# Patient Record
Sex: Male | Born: 1965 | Race: White | Hispanic: No | Marital: Married | Smoking: Former smoker
Health system: Southern US, Community
[De-identification: ages and names within clinical notes are randomized; demographics above are authoritative.]

## PROBLEM LIST (undated history)

## (undated) DIAGNOSIS — N200 Calculus of kidney: Secondary | ICD-10-CM

## (undated) HISTORY — PX: TONSILLECTOMY: SUR1361

---

## 2014-01-17 ENCOUNTER — Emergency Department (HOSPITAL_BASED_OUTPATIENT_CLINIC_OR_DEPARTMENT_OTHER): Payer: Commercial Managed Care - PPO

## 2014-01-17 ENCOUNTER — Encounter (HOSPITAL_BASED_OUTPATIENT_CLINIC_OR_DEPARTMENT_OTHER): Payer: Self-pay | Admitting: Emergency Medicine

## 2014-01-17 ENCOUNTER — Emergency Department (HOSPITAL_BASED_OUTPATIENT_CLINIC_OR_DEPARTMENT_OTHER)
Admission: EM | Admit: 2014-01-17 | Discharge: 2014-01-17 | Disposition: A | Discharge status: Home / Self Care | Payer: Commercial Managed Care - PPO | Attending: Emergency Medicine | Admitting: Emergency Medicine

## 2014-01-17 DIAGNOSIS — R079 Chest pain, unspecified: Secondary | ICD-10-CM

## 2014-01-17 DIAGNOSIS — E669 Obesity, unspecified: Secondary | ICD-10-CM | POA: Insufficient documentation

## 2014-01-17 DIAGNOSIS — R072 Precordial pain: Secondary | ICD-10-CM | POA: Insufficient documentation

## 2014-01-17 HISTORY — DX: Calculus of kidney: N20.0

## 2014-01-17 LAB — BASIC METABOLIC PANEL
BUN: 13 mg/dL (ref 6–23)
CALCIUM: 9.2 mg/dL (ref 8.4–10.5)
CHLORIDE: 105 meq/L (ref 96–112)
CO2: 26 meq/L (ref 19–32)
Creatinine, Ser: 1.1 mg/dL (ref 0.50–1.35)
GFR calc Af Amer: >90 mL/min (ref >90)
GFR calc non Af Amer: 79 mL/min — ABNORMAL LOW (ref >90)
GLUCOSE: 93 mg/dL (ref 70–99)
Potassium: 4.1 mEq/L (ref 3.7–5.3)
SODIUM: 141 meq/L (ref 137–147)

## 2014-01-17 LAB — CBC WITH DIFFERENTIAL/PLATELET
BASOS ABS: 0 10*3/uL (ref 0.0–0.1)
Basophils Relative: 1 % (ref 0–1)
EOS PCT: 3 % (ref 0–5)
Eosinophils Absolute: 0.2 10*3/uL (ref 0.0–0.7)
HEMATOCRIT: 41.4 % (ref 39.0–52.0)
Hemoglobin: 14.1 g/dL (ref 13.0–17.0)
LYMPHS ABS: 1.9 10*3/uL (ref 0.7–4.0)
LYMPHS PCT: 26 % (ref 12–46)
MCH: 29.8 pg (ref 26.0–34.0)
MCHC: 34.1 g/dL (ref 30.0–36.0)
MCV: 87.5 fL (ref 78.0–100.0)
Monocytes Absolute: 0.7 10*3/uL (ref 0.1–1.0)
Monocytes Relative: 9 % (ref 3–12)
NEUTROS ABS: 4.8 10*3/uL (ref 1.7–7.7)
Neutrophils Relative %: 63 % (ref 43–77)
Platelets: 193 10*3/uL (ref 150–400)
RBC: 4.73 MIL/uL (ref 4.22–5.81)
RDW: 11.7 % (ref 11.5–15.5)
WBC: 7.6 10*3/uL (ref 4.0–10.5)

## 2014-01-17 LAB — TROPONIN I: Troponin I: <0.3 ng/mL (ref <0.30)

## 2014-01-17 MED ORDER — NITROGLYCERIN 0.4 MG SL SUBL
0.4000 mg | SUBLINGUAL_TABLET | SUBLINGUAL | Status: AC | PRN
  Administered 2014-01-17 (×3): 0.4 mg via SUBLINGUAL
  Filled 2014-01-17 (×2): qty 25

## 2014-01-17 MED ORDER — ASPIRIN 81 MG PO CHEW
324.0000 mg | CHEWABLE_TABLET | Freq: Once | ORAL | Status: AC
  Administered 2014-01-17: 162 mg via ORAL
  Filled 2014-01-17: qty 4

## 2014-01-17 NOTE — ED Notes (Signed)
Substernal CP 4/10 that woke him up during the night.  Sts he had this pain last week for a couple days and he "figured it was a pulled muscle."  No prior hx of cp.

## 2014-01-17 NOTE — ED Notes (Signed)
Patient transported to X-ray 

## 2014-01-17 NOTE — ED Provider Notes (Signed)
CSN: [id number]     Arrival date & time 01/17/14  1120 History   First MD Initiated Contact with Patient 01/17/14 1127     Chief Complaint  Patient presents with  . Chest Pain   (Consider location/radiation/quality/duration/timing/severity/associated sxs/prior Treatment) HPI 48 year old male presents today complaining of chest pain that has been present since 2 AM. He states he had some chest pain earlier in the week thought that it was musculoskeletal in nature. At that time it was present for several days without resolution. It then went away without intervention. It was worse with movement. He had pain that was present in the substernal region on awakening at about 2 AM. This pain has been present constantly at a level of 4/10 since that time. He has had no associated symptoms. He has no prior history of cardiac problems and has no family history of cardiac problems. He is not a smoker History reviewed. No pertinent past medical history. History reviewed. No pertinent past surgical history. No family history on file. History  Substance Use Topics  . Smoking status: Not on file  . Smokeless tobacco: Not on file  . Alcohol Use: Not on file    Review of Systems  All other systems reviewed and are negative.    Allergies  Review of patient's allergies indicates not on file.  Home Medications  No current outpatient prescriptions on file. BP 142/90  Pulse 68  Temp(Src) 98.8 F (37.1 C) (Oral)  Resp 16  Ht 5\' 9"  (1.753 m)  Wt 204 lb (92.534 kg)  BMI 30.11 kg/m2  SpO2 99% Physical Exam  Nursing note and vitals reviewed. Constitutional: He is oriented to person, place, and time. He appears well-developed and well-nourished.  Obese  HENT:  Head: Normocephalic and atraumatic.  Right Ear: External ear normal.  Left Ear: External ear normal.  Eyes: Conjunctivae and EOM are normal. Pupils are equal, round, and reactive to light.  Neck: Normal range of motion. Neck supple.   Cardiovascular: Normal rate, regular rhythm, normal heart sounds and intact distal pulses.   Pulmonary/Chest: Effort normal and breath sounds normal.  Abdominal: Soft. Bowel sounds are normal.  Musculoskeletal: Normal range of motion. He exhibits no edema and no tenderness.  Neurological: He is alert and oriented to person, place, and time. He has normal reflexes.  Skin: Skin is warm and dry.  Psychiatric: He has a normal mood and affect. His behavior is normal. Judgment and thought content normal.    ED Course  Procedures (including critical care time) Labs Review Labs Reviewed - No data to display Imaging Review No results found.  EKG Interpretation    Date/Time:  Sunday January 17 2014 11:25:56 EST Ventricular Rate:  68 PR Interval:  144 QRS Duration: 90 QT Interval:  370 QTC Calculation: 393 R Axis:   -3 Text Interpretation:  Normal sinus rhythm Cannot rule out Anterior infarct , age undetermined Abnormal ECG Confirmed by Jireh Elmore MD, Kazue Cerro ([id number]) on 01/17/2014 11:28:37 AM            MDM  The patient pain free here after 3 sublingual that glycerin. He received the aspirin here in the emergency department. EKG is normal and delta troponin is normal with total from beginning of pain time  To 12.5 hours. Discussed patient's care with Dr. Diona Browner. He will have the office call the patient tomorrow for an appointment in the next 48-72 hours. I have confirmed a phone number with Dr. Diona Browner and with the patient's wife. He  is given return precautions if he has chest pain or any other worsening symptoms such as shortness of breath or diaphoresis he should return to the hospital. He is instructed to rest in the interim until he is followed up.  The patient voices understanding of plan and is discharged home in improved condition.  Hilario Quarryanielle S Mansi Tokar, MD 01/17/14 [phone]

## 2014-01-17 NOTE — ED Notes (Signed)
MD at bedside. 

## 2014-01-17 NOTE — ED Notes (Signed)
MD at bedside discussing test results and dispo plan of care. 

## 2014-01-17 NOTE — Discharge Instructions (Signed)
You should receive a call from the cardiologist's office tomorrow for followup in one to 3 days. Please call them if you do not receive a call. Return if you are worse at any time. Please do not do anything exertional and followup if you are worse especially with chest pain, shortness of breath, or sweating.  Chest Pain (Nonspecific) It is often hard to give a specific diagnosis for the cause of chest pain. There is always a chance that your pain could be related to something serious, such as a heart attack or a blood clot in the lungs. You need to follow up with your caregiver for further evaluation. CAUSES   Heartburn.  Pneumonia or bronchitis.  Anxiety or stress.  Inflammation around your heart (pericarditis) or lung (pleuritis or pleurisy).  A blood clot in the lung.  A collapsed lung (pneumothorax). It can develop suddenly on its own (spontaneous pneumothorax) or from injury (trauma) to the chest.  Shingles infection (herpes zoster virus). The chest wall is composed of bones, muscles, and cartilage. Any of these can be the source of the pain.  The bones can be bruised by injury.  The muscles or cartilage can be strained by coughing or overwork.  The cartilage can be affected by inflammation and become sore (costochondritis). DIAGNOSIS  Lab tests or other studies, such as X-rays, electrocardiography, stress testing, or cardiac imaging, may be needed to find the cause of your pain.  TREATMENT   Treatment depends on what may be causing your chest pain. Treatment may include:  Acid blockers for heartburn.  Anti-inflammatory medicine.  Pain medicine for inflammatory conditions.  Antibiotics if an infection is present.  You may be advised to change lifestyle habits. This includes stopping smoking and avoiding alcohol, caffeine, and chocolate.  You may be advised to keep your head raised (elevated) when sleeping. This reduces the chance of acid going backward from your stomach  into your esophagus.  Most of the time, nonspecific chest pain will improve within 2 to 3 days with rest and mild pain medicine. HOME CARE INSTRUCTIONS   If antibiotics were prescribed, take your antibiotics as directed. Finish them even if you start to feel better.  For the next few days, avoid physical activities that bring on chest pain. Continue physical activities as directed.  Do not smoke.  Avoid drinking alcohol.  Only take over-the-counter or prescription medicine for pain, discomfort, or fever as directed by your caregiver.  Follow your caregiver's suggestions for further testing if your chest pain does not go away.  Keep any follow-up appointments you made. If you do not go to an appointment, you could develop lasting (chronic) problems with pain. If there is any problem keeping an appointment, you must call to reschedule. SEEK MEDICAL CARE IF:   You think you are having problems from the medicine you are taking. Read your medicine instructions carefully.  Your chest pain does not go away, even after treatment.  You develop a rash with blisters on your chest. SEEK IMMEDIATE MEDICAL CARE IF:   You have increased chest pain or pain that spreads to your arm, neck, jaw, back, or abdomen.  You develop shortness of breath, an increasing cough, or you are coughing up blood.  You have severe back or abdominal pain, feel nauseous, or vomit.  You develop severe weakness, fainting, or chills.  You have a fever. THIS IS AN EMERGENCY. Do not wait to see if the pain will go away. Get medical help at once.  Call your local emergency services (911 in [place] Do not drive yourself to the hospital. MAKE SURE YOU:   Understand these instructions.  Will watch your condition.  Will get help right away if you are not doing well or get worse. Document Released: 09/12/2005 Document Revised: 02/25/2012 Document Reviewed: 07/08/2008 [facility] Patient Information 2014 [place], [place].

## 2014-02-03 DIAGNOSIS — N200 Calculus of kidney: Secondary | ICD-10-CM | POA: Insufficient documentation

## 2014-02-04 ENCOUNTER — Encounter: Payer: Self-pay | Admitting: Cardiology

## 2014-02-04 ENCOUNTER — Ambulatory Visit (INDEPENDENT_AMBULATORY_CARE_PROVIDER_SITE_OTHER): Payer: Commercial Managed Care - PPO | Admitting: Cardiology

## 2014-02-04 VITALS — BP 108/78 | HR 80 | Ht 69.0 in | Wt 214.0 lb

## 2014-02-04 DIAGNOSIS — R079 Chest pain, unspecified: Secondary | ICD-10-CM | POA: Insufficient documentation

## 2014-02-04 NOTE — Patient Instructions (Signed)
Your physician has requested that you have an exercise tolerance test. For further information please visit [email or web address]. Please also follow instruction sheet, as given.    Exercise Stress Electrocardiogram An exercise stress electrocardiogram is a test to check how blood flows to your heart. It is done to find areas of poor blood flow. You will need to walk on a treadmill for this test. The electrocardiogram will record your heartbeat when you are at rest and when you are exercising. BEFORE THE PROCEDURE  Do not have drinks with caffeine or foods with caffeine for 24 hours before the test, or as told by your doctor.  Follow your doctor's instructions about eating and drinking before the test.  Ask your doctor what medicines you should or should not take before the test. Take your medicines with water unless told by your doctor not to.  If you use an inhaler, bring it with you to the test.  Bring a snack to eat after the test.  Do not  smoke for 4 hours before the test.  Wear comfortable shoes and clothing. PROCEDURE  You will have patches put on your chest. Small areas of your chest may need to be shaved. Wires will be connected to the patches.  Your heart rate will be watched while you are resting and while you are exercising.  You will walk on the treadmill. The treadmill will slowly get faster to raise your heart rate.  The test will take about 1 2 hours. AFTER THE PROCEDURE  Your heart rate and blood pressure will be watched after the test.  You may return to your normal diet, activities, and medicines or as told by your doctor. Document Released: 05/21/2008 Document Revised: 09/23/2013 Document Reviewed: 08/10/2013 [employer] Patient Information 2014 [employer].  

## 2014-02-04 NOTE — Progress Notes (Signed)
     HPI: 48 year old male for evaluation of chest pain. Seen in the emergency room in February of 2015 with chest pain. Chest x-ray showed no acute cardiopulmonary disease. Electrocardiogram showed no ST changes. Hemoglobin normal. Troponin negative x2. BUN and creatinine normal. The patient typically does not have dyspnea on exertion, orthopnea, PND, pedal edema, exertional chest pain, palpitations or syncope. Approximately one month ago the patient developed chest pain in the substernal area radiating to the right. No associated symptoms. Not pleuritic, positional or related to food. It lasted 2 days and resolved. He felt it may be a pulled muscle. Approximately one week later he awoke with substernal chest pain without radiation. No associated symptoms. Again not pleuritic or positional and no water brash. Patient was taken to the emergency room and symptoms relieved with nitroglycerin after 9 hours. No pain since. Workup as described above.  No current outpatient prescriptions on file.   No current facility-administered medications for this visit.    Allergies  Allergen Reactions  . Amoxicillin Nausea Only    Past Medical History  Diagnosis Date  . Kidney stones     Past Surgical History  Procedure Laterality Date  . Tonsillectomy      History   Social History  . Marital Status: Married    Spouse Name: N/A    Number of Children: 3  . Years of Education: N/A   Occupational History  .     Social History Main Topics  . Smoking status: Former Games developermoker  . Smokeless tobacco: Never Used  . Alcohol Use: Yes     Comment: rarely  . Drug Use: No  . Sexual Activity: Not on file   Other Topics Concern  . Not on file   Social History Narrative  . No narrative on file    Family History  Problem Relation Age of Onset  . Heart disease      No family history    ROS: no fevers or chills, productive cough, hemoptysis, dysphasia, odynophagia, melena, hematochezia, dysuria,  hematuria, rash, seizure activity, orthopnea, PND, pedal edema, claudication. Remaining systems are negative.  Physical Exam:   Blood pressure 108/78, pulse 80, height 5\' 9"  (1.753 m), weight 214 lb (97.07 kg).  General:  Well developed/well nourished in NAD Skin warm/dry Patient not depressed No peripheral clubbing Back-normal HEENT-normal/normal eyelids Neck supple/normal carotid upstroke bilaterally; no bruits; no JVD; no thyromegaly chest - CTA/ normal expansion CV - RRR/normal S1 and S2; no murmurs, rubs or gallops;  PMI nondisplaced Abdomen -NT/ND, no HSM, no mass, + bowel sounds, no bruit 2+ femoral pulses, no bruits Ext-no edema, chords, 2+ DP Neuro-grossly nonfocal  ECG 01/18/2012-sinus rhythm with no ST changes.

## 2014-02-04 NOTE — Assessment & Plan Note (Signed)
Symptoms atypical. Previous electrocardiogram and troponins normal. Symptoms were relieved with nitroglycerin. Plan exercise treadmill for risk stratification. If symptoms persist and exercise treadmill negative may need GI evaluation.

## 2014-02-16 ENCOUNTER — Ambulatory Visit (HOSPITAL_COMMUNITY)
Admission: RE | Admit: 2014-02-16 | Discharge: 2014-02-16 | Disposition: A | Discharge status: Home / Self Care | Payer: Commercial Managed Care - PPO | Source: Ambulatory Visit | Attending: Cardiology | Admitting: Cardiology

## 2014-02-16 DIAGNOSIS — R079 Chest pain, unspecified: Secondary | ICD-10-CM

## 2014-02-22 ENCOUNTER — Telehealth (HOSPITAL_COMMUNITY): Payer: Self-pay | Admitting: *Deleted

## 2015-03-08 ENCOUNTER — Encounter (HOSPITAL_COMMUNITY): Payer: Self-pay | Admitting: *Deleted

## 2015-03-08 ENCOUNTER — Emergency Department (HOSPITAL_COMMUNITY)
Admission: EM | Admit: 2015-03-08 | Discharge: 2015-03-08 | Disposition: A | Discharge status: Home / Self Care | Payer: Commercial Managed Care - PPO | Attending: Emergency Medicine | Admitting: Emergency Medicine

## 2015-03-08 ENCOUNTER — Emergency Department (HOSPITAL_COMMUNITY): Payer: Commercial Managed Care - PPO

## 2015-03-08 DIAGNOSIS — Z87442 Personal history of urinary calculi: Secondary | ICD-10-CM | POA: Diagnosis not present

## 2015-03-08 DIAGNOSIS — R109 Unspecified abdominal pain: Secondary | ICD-10-CM | POA: Diagnosis present

## 2015-03-08 DIAGNOSIS — N23 Unspecified renal colic: Secondary | ICD-10-CM | POA: Diagnosis not present

## 2015-03-08 DIAGNOSIS — Z87891 Personal history of nicotine dependence: Secondary | ICD-10-CM | POA: Diagnosis not present

## 2015-03-08 LAB — COMPREHENSIVE METABOLIC PANEL
ALBUMIN: 4.4 g/dL (ref 3.5–5.2)
ALT: 21 U/L (ref 0–53)
AST: 19 U/L (ref 0–37)
Alkaline Phosphatase: 63 U/L (ref 39–117)
Anion gap: 12 (ref 5–15)
BILIRUBIN TOTAL: 0.6 mg/dL (ref 0.3–1.2)
BUN: 16 mg/dL (ref 6–23)
CALCIUM: 9 mg/dL (ref 8.4–10.5)
CHLORIDE: 102 mmol/L (ref 96–112)
CO2: 23 mmol/L (ref 19–32)
CREATININE: 1.26 mg/dL (ref 0.50–1.35)
GFR calc Af Amer: 77 mL/min — ABNORMAL LOW (ref >90)
GFR calc non Af Amer: 66 mL/min — ABNORMAL LOW (ref >90)
Glucose, Bld: 163 mg/dL — ABNORMAL HIGH (ref 70–99)
POTASSIUM: 3.5 mmol/L (ref 3.5–5.1)
Sodium: 137 mmol/L (ref 135–145)
Total Protein: 6.9 g/dL (ref 6.0–8.3)

## 2015-03-08 LAB — URINALYSIS, ROUTINE W REFLEX MICROSCOPIC
BILIRUBIN URINE: NEGATIVE
Glucose, UA: NEGATIVE mg/dL
Ketones, ur: 40 mg/dL — AB
Leukocytes, UA: NEGATIVE
NITRITE: NEGATIVE
Protein, ur: NEGATIVE mg/dL
Specific Gravity, Urine: 1.021 (ref 1.005–1.030)
UROBILINOGEN UA: 1 mg/dL (ref 0.0–1.0)
pH: 5.5 (ref 5.0–8.0)

## 2015-03-08 LAB — URINE MICROSCOPIC-ADD ON

## 2015-03-08 LAB — CBC WITH DIFFERENTIAL/PLATELET
BASOS ABS: 0 10*3/uL (ref 0.0–0.1)
BASOS PCT: 0 % (ref 0–1)
Eosinophils Absolute: 0.1 10*3/uL (ref 0.0–0.7)
Eosinophils Relative: 1 % (ref 0–5)
HEMATOCRIT: 40 % (ref 39.0–52.0)
Hemoglobin: 13.5 g/dL (ref 13.0–17.0)
LYMPHS PCT: 21 % (ref 12–46)
Lymphs Abs: 1.9 10*3/uL (ref 0.7–4.0)
MCH: 28.6 pg (ref 26.0–34.0)
MCHC: 33.8 g/dL (ref 30.0–36.0)
MCV: 84.7 fL (ref 78.0–100.0)
MONO ABS: 0.7 10*3/uL (ref 0.1–1.0)
Monocytes Relative: 8 % (ref 3–12)
Neutro Abs: 6 10*3/uL (ref 1.7–7.7)
Neutrophils Relative %: 70 % (ref 43–77)
PLATELETS: 198 10*3/uL (ref 150–400)
RBC: 4.72 MIL/uL (ref 4.22–5.81)
RDW: 13 % (ref 11.5–15.5)
WBC: 8.7 10*3/uL (ref 4.0–10.5)

## 2015-03-08 LAB — LIPASE, BLOOD: Lipase: 46 U/L (ref 11–59)

## 2015-03-08 MED ORDER — FENTANYL CITRATE 0.05 MG/ML IJ SOLN: 50.0000 ug | Freq: Once | INTRAMUSCULAR | Status: DC

## 2015-03-08 MED ORDER — OXYCODONE-ACETAMINOPHEN 5-325 MG PO TABS: 1.0000 | ORAL_TABLET | ORAL | Status: DC | PRN

## 2015-03-08 MED ORDER — KETOROLAC TROMETHAMINE 10 MG PO TABS: 10.0000 mg | ORAL_TABLET | Freq: Four times a day (QID) | ORAL | Status: DC | PRN

## 2015-03-08 MED ORDER — ONDANSETRON 4 MG PO TBDP
ORAL_TABLET | ORAL | Status: DC
Start: 2015-03-08 — End: 2024-08-07

## 2015-03-08 MED ORDER — TAMSULOSIN HCL 0.4 MG PO CAPS: 0.4000 mg | ORAL_CAPSULE | Freq: Every day | ORAL | Status: DC

## 2015-03-08 NOTE — ED Notes (Signed)
Pt a/o x 4 on d/c with family. 

## 2015-03-08 NOTE — Discharge Instructions (Signed)

## 2015-03-08 NOTE — ED Provider Notes (Signed)
CSN: [id number]     Arrival date & time 03/08/15  0110 History  This chart was scribed for Loren Raceravid Tayvin Preslar, MD by Richarda Overlieichard Holland, ED Scribe. This patient was seen in room D32C/D32C and the patient's care was started 1:57 AM.   Chief Complaint  Patient presents with  . Flank Pain   The history is provided by the patient. No language interpreter was used.   HPI Comments: Glo HerringJerry Reeves is a 49 y.o. male with a history of kidney stones who presents to the Emergency Department complaining of sudden right flank pain that started at 9:45PM last night. Pt states that his pain radiates towards his abdomen and is waxing and waning. Pt states that he is in minimal pain at this time compared to severe pain when he presented to the ED. He reports associated nausea but says it has improved at this time. He states that he tried to take a hot shower to relieve his pain which failed to provide him pain relief. Pt reports his last kidney stones was 8 years ago and states that he does not think his current episode is a kidney stone because his pain is in a different location. He reports he is allergic to amoxicillin. Pt denies any urinary symptoms.    Past Medical History  Diagnosis Date  . Kidney stones    Past Surgical History  Procedure Laterality Date  . Tonsillectomy     Family History  Problem Relation Age of Onset  . Heart disease      No family history   History  Substance Use Topics  . Smoking status: Former Games developermoker  . Smokeless tobacco: Never Used  . Alcohol Use: Yes     Comment: rarely    Review of Systems  Constitutional: Negative for fever and chills.  Respiratory: Negative for cough and shortness of breath.   Cardiovascular: Negative for chest pain.  Gastrointestinal: Positive for nausea and abdominal pain. Negative for vomiting, diarrhea and constipation.  Genitourinary: Positive for flank pain. Negative for dysuria, hematuria and difficulty urinating.  Musculoskeletal: Positive for  back pain. Negative for myalgias, neck pain and neck stiffness.  Skin: Negative for rash and wound.  Neurological: Negative for dizziness, weakness, light-headedness, numbness and headaches.  All other systems reviewed and are negative.     Allergies  Amoxicillin  Home Medications   Prior to Admission medications   Medication Sig Start Date End Date Taking? Authorizing Provider  ibuprofen (ADVIL,MOTRIN) 200 MG tablet Take 400 mg by mouth every 6 (six) hours as needed for mild pain.   Yes Historical Provider, MD  ketorolac (TORADOL) 10 MG tablet Take 1 tablet (10 mg total) by mouth every 6 (six) hours as needed. 03/08/15   Loren Raceravid Resean Brander, MD  ondansetron (ZOFRAN ODT) 4 MG disintegrating tablet 4mg  ODT q4 hours prn nausea/vomit 03/08/15   Loren Raceravid Jeraldine Primeau, MD  oxyCODONE-acetaminophen (PERCOCET) 5-325 MG per tablet Take 1-2 tablets by mouth every 4 (four) hours as needed for moderate pain or severe pain. 03/08/15   Loren Raceravid Shirla Hodgkiss, MD  tamsulosin (FLOMAX) 0.4 MG CAPS capsule Take 1 capsule (0.4 mg total) by mouth daily. 03/08/15   Loren Raceravid Field Staniszewski, MD   BP 118/76 mmHg  Pulse 77  Temp(Src) 98 F (36.7 C) (Oral)  Resp 17  SpO2 98% Physical Exam  Constitutional: He is oriented to person, place, and time. He appears well-developed and well-nourished. No distress.  HENT:  Head: Normocephalic and atraumatic.  Mouth/Throat: Oropharynx is clear and moist.  Eyes: EOM  are normal. Pupils are equal, round, and reactive to light. Right eye exhibits no discharge. Left eye exhibits no discharge.  Neck: Normal range of motion. Neck supple. No tracheal deviation present.  Cardiovascular: Normal rate and regular rhythm.   Pulmonary/Chest: Effort normal and breath sounds normal. No respiratory distress. He has no wheezes. He has no rales.  Abdominal: Soft. Bowel sounds are normal. He exhibits no distension and no mass. There is no tenderness. There is no rebound and no guarding.  Musculoskeletal: Normal  range of motion. He exhibits no edema or tenderness.  No CVA tenderness bilaterally.  Neurological: He is alert and oriented to person, place, and time.  Skin: Skin is warm and dry. No rash noted. No erythema.  Psychiatric: He has a normal mood and affect. His behavior is normal.  Nursing note and vitals reviewed.   ED Course  Procedures   DIAGNOSTIC STUDIES: Oxygen Saturation is 100% on RA, normal by my interpretation.    COORDINATION OF CARE: 2:03 AM Discussed treatment plan with pt at bedside and pt agreed to plan.   Labs Review Labs Reviewed  COMPREHENSIVE METABOLIC PANEL - Abnormal; Notable for the following:    Glucose, Bld 163 (*)    GFR calc non Af Amer 66 (*)    GFR calc Af Amer 77 (*)    All other components within normal limits  URINALYSIS, ROUTINE W REFLEX MICROSCOPIC - Abnormal; Notable for the following:    APPearance CLOUDY (*)    Hgb urine dipstick MODERATE (*)    Ketones, ur 40 (*)    All other components within normal limits  CBC WITH DIFFERENTIAL/PLATELET  LIPASE, BLOOD  URINE MICROSCOPIC-ADD ON    Imaging Review Ct Abdomen Pelvis Wo Contrast  03/08/2015   CLINICAL DATA:  Right flank pain, onset last night.  EXAM: CT ABDOMEN AND PELVIS WITHOUT CONTRAST  TECHNIQUE: Multidetector CT imaging of the abdomen and pelvis was performed following the standard protocol without IV contrast.  COMPARISON:  None.  FINDINGS: There is a 3 mm calculus at the right ureterovesical junction with hydroureter and moderate hydronephrosis. No other urinary calculi are evident. No other acute findings are evident in the abdomen or pelvis.  There are unremarkable appearances of the liver, spleen, pancreas and adrenals. Bowel is unremarkable. No significant abnormality is evident in the lower chest  IMPRESSION: Obstructing 3 mm right UVJ calculus with hydroureter and moderate hydronephrosis.   Electronically Signed   By: Ellery Plunk M.D.   On: 03/08/2015 03:01     EKG  Interpretation None      MDM   Final diagnoses:  Renal colic on right side    I personally performed the services described in this documentation, which was scribed in my presence. The recorded information has been reviewed and is accurate.  Patient remains asymptomatic. Discharge home with follow-up with urology. Return precautions given.     Loren Racer, MD 03/08/15 [phone]

## 2015-03-08 NOTE — ED Notes (Signed)
Pt in c/o right flank pain that started about an hour ago, states he has a history of kidney stones and this doesn't feel the same, c/o nausea but denies vomiting, pt restless in triage

## 2018-03-12 ENCOUNTER — Emergency Department (HOSPITAL_COMMUNITY): Payer: Worker's Compensation

## 2018-03-12 ENCOUNTER — Emergency Department (HOSPITAL_COMMUNITY)
Admission: EM | Admit: 2018-03-12 | Discharge: 2018-03-12 | Disposition: A | Discharge status: Home / Self Care | Payer: Worker's Compensation | Attending: Emergency Medicine | Admitting: Emergency Medicine

## 2018-03-12 ENCOUNTER — Encounter (HOSPITAL_COMMUNITY): Payer: Self-pay | Admitting: Emergency Medicine

## 2018-03-12 ENCOUNTER — Other Ambulatory Visit: Payer: Self-pay

## 2018-03-12 DIAGNOSIS — Y939 Activity, unspecified: Secondary | ICD-10-CM | POA: Diagnosis not present

## 2018-03-12 DIAGNOSIS — Z79899 Other long term (current) drug therapy: Secondary | ICD-10-CM | POA: Insufficient documentation

## 2018-03-12 DIAGNOSIS — Y999 Unspecified external cause status: Secondary | ICD-10-CM | POA: Insufficient documentation

## 2018-03-12 DIAGNOSIS — W2209XA Striking against other stationary object, initial encounter: Secondary | ICD-10-CM | POA: Insufficient documentation

## 2018-03-12 DIAGNOSIS — S060X0A Concussion without loss of consciousness, initial encounter: Secondary | ICD-10-CM | POA: Diagnosis not present

## 2018-03-12 DIAGNOSIS — Z87891 Personal history of nicotine dependence: Secondary | ICD-10-CM | POA: Diagnosis not present

## 2018-03-12 DIAGNOSIS — Y929 Unspecified place or not applicable: Secondary | ICD-10-CM | POA: Diagnosis not present

## 2018-03-12 DIAGNOSIS — S0990XA Unspecified injury of head, initial encounter: Secondary | ICD-10-CM | POA: Diagnosis present

## 2018-03-12 NOTE — Discharge Instructions (Addendum)
Please read attached information. If you experience any new or worsening signs or symptoms please return to the emergency room for evaluation. Please follow-up with your primary care provider or specialist as discussed.  °
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
## 2019-12-09 NOTE — Telephone Encounter (Signed)
completed

## 2021-05-23 DIAGNOSIS — K353 Acute appendicitis with localized peritonitis, without perforation or gangrene: Secondary | ICD-10-CM | POA: Insufficient documentation

## 2024-05-20 ENCOUNTER — Inpatient Hospital Stay (HOSPITAL_COMMUNITY)
Admission: EM | Admit: 2024-05-20 | Discharge: 2024-05-22 | DRG: 322 | Disposition: A | Discharge status: Home / Self Care | Payer: Self-pay | Attending: Cardiology | Admitting: Cardiology

## 2024-05-20 ENCOUNTER — Other Ambulatory Visit: Payer: Self-pay

## 2024-05-20 ENCOUNTER — Emergency Department (HOSPITAL_COMMUNITY): Payer: Self-pay

## 2024-05-20 ENCOUNTER — Encounter (HOSPITAL_COMMUNITY): Payer: Self-pay

## 2024-05-20 DIAGNOSIS — E785 Hyperlipidemia, unspecified: Secondary | ICD-10-CM | POA: Insufficient documentation

## 2024-05-20 DIAGNOSIS — Z955 Presence of coronary angioplasty implant and graft: Secondary | ICD-10-CM

## 2024-05-20 DIAGNOSIS — I10 Essential (primary) hypertension: Secondary | ICD-10-CM | POA: Insufficient documentation

## 2024-05-20 DIAGNOSIS — I214 Non-ST elevation (NSTEMI) myocardial infarction: Principal | ICD-10-CM | POA: Diagnosis present

## 2024-05-20 LAB — BASIC METABOLIC PANEL WITH GFR
Anion gap: 10 (ref 5–15)
BUN: 22 mg/dL — ABNORMAL HIGH (ref 6–20)
CO2: 22 mmol/L (ref 22–32)
Calcium: 8.8 mg/dL — ABNORMAL LOW (ref 8.9–10.3)
Chloride: 105 mmol/L (ref 98–111)
Creatinine, Ser: 1.16 mg/dL (ref 0.61–1.24)
GFR, Estimated: >60 mL/min (ref >60)
Glucose, Bld: 87 mg/dL (ref 70–99)
Potassium: 3.7 mmol/L (ref 3.5–5.1)
Sodium: 137 mmol/L (ref 135–145)

## 2024-05-20 LAB — BRAIN NATRIURETIC PEPTIDE: B Natriuretic Peptide: 24.4 pg/mL (ref 0.0–100.0)

## 2024-05-20 LAB — CBC
HCT: 40.4 % (ref 39.0–52.0)
Hemoglobin: 13.9 g/dL (ref 13.0–17.0)
MCH: 30.3 pg (ref 26.0–34.0)
MCHC: 34.4 g/dL (ref 30.0–36.0)
MCV: 88 fL (ref 80.0–100.0)
Platelets: 248 10*3/uL (ref 150–400)
RBC: 4.59 MIL/uL (ref 4.22–5.81)
RDW: 12 % (ref 11.5–15.5)
WBC: 8.4 10*3/uL (ref 4.0–10.5)
nRBC: 0 % (ref 0.0–0.2)

## 2024-05-20 LAB — TROPONIN I (HIGH SENSITIVITY)
Troponin I (High Sensitivity): 79 ng/L — ABNORMAL HIGH (ref <18)
Troponin I (High Sensitivity): 114 ng/L (ref <18)

## 2024-05-20 MED ORDER — HEPARIN (PORCINE) 25000 UT/250ML-% IV SOLN
1300.0000 [IU]/h | INTRAVENOUS | Status: DC
  Administered 2024-05-20: 1300 [IU]/h via INTRAVENOUS
  Filled 2024-05-20: qty 250

## 2024-05-20 MED ORDER — ASPIRIN 81 MG PO CHEW
324.0000 mg | CHEWABLE_TABLET | Freq: Once | ORAL | Status: AC
  Administered 2024-05-20: 324 mg via ORAL
  Filled 2024-05-20: qty 4

## 2024-05-20 MED ORDER — HEPARIN BOLUS VIA INFUSION
4000.0000 [IU] | Freq: Once | INTRAVENOUS | Status: AC
  Administered 2024-05-20: 4000 [IU] via INTRAVENOUS
  Filled 2024-05-20: qty 4000

## 2024-05-20 MED ORDER — ALUM & MAG HYDROXIDE-SIMETH 200-200-20 MG/5ML PO SUSP
30.0000 mL | Freq: Once | ORAL | Status: AC
  Administered 2024-05-20: 30 mL via ORAL
  Filled 2024-05-20: qty 30

## 2024-05-20 MED ORDER — ROSUVASTATIN CALCIUM 20 MG PO TABS
20.0000 mg | ORAL_TABLET | Freq: Every day | ORAL | Status: DC
  Administered 2024-05-21 – 2024-05-22 (×2): 20 mg via ORAL
  Filled 2024-05-20 (×2): qty 1

## 2024-05-20 NOTE — ED Triage Notes (Signed)
 Pt c/o chest pain, increasing with exertion. Pt states this has been going on for a few weeks, but will not go away

## 2024-05-20 NOTE — ED Provider Notes (Addendum)
 [name]   CSN: [id number] Arrival date & time: 05/20/24  1752     History  Chief Complaint  Patient presents with   Chest Pain    Joseph Kim is a 58 y.o. male.  58 yo M with a cc of chest pain.  This been going on for maybe a couple weeks.  He thought he had indigestion.  Seem to be worse with exertion worse with lying back flat.  No shortness of breath.  Patient denies history of MI, denies hypertension hyperlipidemia diabetes or smoking.  Father had an MI in his 76s.  Patient denies history of PE or DVT denies hemoptysis denies unilateral lower extremity edema denies recent surgery immobilization hospitalization estrogen use or history of cancer.     Chest Pain      Home Medications Prior to Admission medications   Medication Sig Start Date End Date Taking? Authorizing Provider  magnesium 30 MG tablet Take 30 mg by mouth daily.   Yes [provider]  Misc Natural Products (BEET ROOT) 500 MG CAPS Take 2 capsules by mouth daily.   Yes [provider]  ibuprofen (ADVIL,MOTRIN) 200 MG tablet Take 400 mg by mouth every 6 (six) hours as needed for mild pain.   Yes [provider]  ketorolac  (TORADOL ) 10 MG tablet Take 1 tablet (10 mg total) by mouth every 6 (six) hours as needed. Patient not taking: Reported on 05/20/2024 03/08/15   Evone Hoh, MD  ondansetron  (ZOFRAN  ODT) 4 MG disintegrating tablet 4mg  ODT q4 hours prn nausea/vomit Patient not taking: Reported on 05/20/2024 03/08/15   Evone Hoh, MD  oxyCODONE -acetaminophen  (PERCOCET) 5-325 MG per tablet Take 1-2 tablets by mouth every 4 (four) hours as needed for moderate pain or severe pain. Patient not taking: Reported on 05/20/2024 03/08/15   Evone Hoh, MD  tamsulosin  (FLOMAX ) 0.4 MG CAPS capsule Take 1 capsule (0.4 mg total) by mouth daily. Patient not taking: Reported on 05/20/2024 03/08/15   Evone Hoh, MD       Allergies    Amoxicillin    Review of Systems   Review of Systems  Cardiovascular:  Positive for chest pain.    Physical Exam Updated Vital Signs BP (!) 141/96   Pulse 78   Temp 98.1 F (36.7 C)   Resp 19   Ht 5' 9 (1.753 m)   Wt 105.8 kg   SpO2 94%   BMI 34.45 kg/m  Physical Exam Vitals and nursing note reviewed.  Constitutional:      Appearance: He is well-developed.  HENT:     Head: Normocephalic and atraumatic.  Eyes:     Pupils: Pupils are equal, round, and reactive to light.  Neck:     Vascular: No JVD.  Cardiovascular:     Rate and Rhythm: Normal rate and regular rhythm.     Heart sounds: No murmur heard.    No friction rub. No gallop.  Pulmonary:     Effort: No respiratory distress.     Breath sounds: No wheezing.  Abdominal:     General: There is no distension.     Tenderness: There is no abdominal tenderness. There is no guarding or rebound.  Musculoskeletal:        General: Normal range of motion.     Cervical back: Normal range of motion and neck supple.  Skin:    Coloration: Skin is not pale.     Findings: No rash.  Neurological:     Mental Status: He is alert and oriented to person, place, and time.  Psychiatric:        Behavior: Behavior normal.     ED Results / Procedures / Treatments   Labs (all labs ordered are listed, but only abnormal results are displayed) Labs Reviewed  BASIC METABOLIC PANEL WITH GFR - Abnormal; Notable for the following components:      Result Value   BUN 22 (*)    Calcium  8.8 (*)    All other components within normal limits  TROPONIN I (HIGH SENSITIVITY) - Abnormal; Notable for the following components:   Troponin I (High Sensitivity) 79 (*)    All other components within normal limits  TROPONIN I (HIGH SENSITIVITY) - Abnormal; Notable for the following components:   Troponin I (High Sensitivity) 114 (*)    All other components within normal limits  CBC  BRAIN NATRIURETIC PEPTIDE  CBC    EKG EKG  Interpretation Date/Time:  Wednesday May 20 2024 17:58:36 EDT Ventricular Rate:  85 PR Interval:  137 QRS Duration:  91 QT Interval:  352 QTC Calculation: 419 R Axis:   -33  Text Interpretation: Sinus rhythm Left axis deviation Abnormal R-wave progression, early transition No significant change since last tracing Confirmed by Albertus Hughs [phone]) on 05/20/2024 7:23:37 PM  Radiology DG Chest 2 View Result Date: 05/20/2024 CLINICAL DATA:  Chest pain. EXAM: CHEST - 2 VIEW COMPARISON:  Chest radiograph dated 01/17/2014. FINDINGS: The heart size and mediastinal contours are within normal limits. Both lungs are clear. The visualized skeletal structures are unremarkable. IMPRESSION: No active cardiopulmonary disease. Electronically Signed   By: Angus Bark M.D.   On: 05/20/2024 18:29    Procedures .Critical Care  Performed by: Albertus Hughs, DO Authorized by: Albertus Hughs, DO   Critical care provider statement:    Critical care time (minutes):  35   Critical care time was exclusive of:  Separately billable procedures and treating other patients   Critical care was time spent personally by me on the following activities:  Development of treatment plan with patient or surrogate, discussions with consultants, evaluation of patient's response to treatment, examination of patient, ordering and review of laboratory studies, ordering and review of radiographic studies, ordering and performing treatments and interventions, pulse oximetry, re-evaluation of patient's condition and review of old charts   Care discussed with: admitting provider       Medications Ordered in ED Medications  rosuvastatin  (CRESTOR ) tablet 20 mg (has no administration in time range)  heparin  ADULT infusion 100 units/mL (25000 units/250mL) (has no administration in time range)  heparin  bolus via infusion 4,000 Units (has no administration in time range)  alum & mag hydroxide-simeth (MAALOX/MYLANTA) 200-200-20 MG/5ML suspension  30 mL (30 mLs Oral Given 05/20/24 1829)  aspirin  chewable tablet 324 mg (324 mg Oral Given 05/20/24 2013)    ED Course/ Medical Decision Making/ A&P                                 Medical Decision Making Amount and/or Complexity of Data Reviewed Labs: ordered. Radiology: ordered.  Risk OTC drugs. Prescription drug management. Decision regarding hospitalization.   58 yo M with a chief complaint of chest pain.  Going on for couple weeks.  Has some typical atypical components.  No obvious difficulty breathing with it.  Not obviously exertional.    Reportedly got much worse today.  Chest x-ray independently interpreted by me without focal infiltrate or pneumothorax.  Will obtain a troponin.  Blood work.  Reassess.  Initial troponin 79.  BNP negative.  No significant electrolyte abnormalities.  I discussed case with the cardiologist.  They recommended delta.  Second troponin is trending upward.  Discussed again with cardiology, Dr. Sanjuana Crutch.  Recommended admission and heparin  drip statin.  The patients results and plan were reviewed and discussed.   Any x-rays performed were independently reviewed by myself.   Differential diagnosis were considered with the presenting HPI.  Medications  rosuvastatin  (CRESTOR ) tablet 20 mg (has no administration in time range)  heparin  ADULT infusion 100 units/mL (25000 units/250mL) (has no administration in time range)  heparin  bolus via infusion 4,000 Units (has no administration in time range)  alum & mag hydroxide-simeth (MAALOX/MYLANTA) 200-200-20 MG/5ML suspension 30 mL (30 mLs Oral Given 05/20/24 1829)  aspirin  chewable tablet 324 mg (324 mg Oral Given 05/20/24 2013)    Vitals:   05/20/24 1815 05/20/24 1830 05/20/24 2130 05/20/24 2245  BP: (!) 146/92 (!) 145/90 (!) 141/96   Pulse: 82 80 78   Resp: 15 18 19    Temp:   98.1 F (36.7 C)   SpO2: 96% 95% 94%   Weight:    105.8 kg  Height:    5' 9 (1.753 m)    Final diagnoses:  NSTEMI (non-ST  elevated myocardial infarction) (HCC)    Admission/ observation were discussed with the admitting physician, patient and/or family and they are comfortable with the plan.          Final Clinical Impression(s) / ED Diagnoses Final diagnoses:  NSTEMI (non-ST elevated myocardial infarction) [facility])    Rx / DC Orders ED Discharge Orders     None         Albertus Hughs, DO 05/20/24 2302    Albertus Hughs, DO 06/01/24 2311

## 2024-05-20 NOTE — Progress Notes (Signed)
 PHARMACY - ANTICOAGULATION CONSULT NOTE  Pharmacy Consult for heparin Indication: chest pain/ACS  Allergies  Allergen Reactions   Amoxicillin Nausea Only    Patient Measurements: Height: 5\' 9"  (175.3 cm) Weight: 105.8 kg (233 lb 4.8 oz) IBW/kg (Calculated) : 70.7 HEPARIN DW (KG): 93.6   Vital Signs: Temp: 98.1 F (36.7 C) (06/04 2130) BP: 141/96 (06/04 2130) Pulse Rate: 78 (06/04 2130)  Labs: Recent Labs    05/20/24 1813 05/20/24 2030  HGB 13.9  --   HCT 40.4  --   PLT 248  --   CREATININE 1.16  --   TROPONINIHS 79* 114*    Estimated Creatinine Clearance: 85.2 mL/min (by C-G formula based on SCr of 1.16 mg/dL).   Medical History: Past Medical History:  Diagnosis Date   Kidney stones     Medications:  Infusions:   Assessment: 58 yo M who presented with chest pain.  Increasing troponin.  Pharmacy consulted to dose IV heparin for NSTEMI.  He is not on anticoagulation PTA Baseline CBC WNL  Goal of Therapy:  Heparin level 0.3-0.7 units/ml Monitor platelets by anticoagulation protocol: Yes   Plan:  Give 4000 units bolus x 1 Start heparin infusion at 1300 units/hr Check anti-Xa level in 6 hours and daily while on heparin Continue to monitor H&H and platelets  Arie Kurtz PharmD 05/20/2024,10:48 PM

## 2024-05-21 ENCOUNTER — Encounter (HOSPITAL_COMMUNITY): Payer: Self-pay | Admitting: Internal Medicine

## 2024-05-21 ENCOUNTER — Encounter (HOSPITAL_COMMUNITY)
Admission: EM | Disposition: A | Discharge status: Home / Self Care | Payer: Self-pay | Source: Home / Self Care | Attending: Cardiology

## 2024-05-21 ENCOUNTER — Observation Stay (HOSPITAL_COMMUNITY): Payer: Self-pay

## 2024-05-21 DIAGNOSIS — Z79899 Other long term (current) drug therapy: Secondary | ICD-10-CM

## 2024-05-21 DIAGNOSIS — Z8249 Family history of ischemic heart disease and other diseases of the circulatory system: Secondary | ICD-10-CM

## 2024-05-21 DIAGNOSIS — Z7982 Long term (current) use of aspirin: Secondary | ICD-10-CM

## 2024-05-21 DIAGNOSIS — Z6834 Body mass index (BMI) 34.0-34.9, adult: Secondary | ICD-10-CM

## 2024-05-21 DIAGNOSIS — Z87442 Personal history of urinary calculi: Secondary | ICD-10-CM

## 2024-05-21 DIAGNOSIS — E785 Hyperlipidemia, unspecified: Secondary | ICD-10-CM | POA: Diagnosis present

## 2024-05-21 DIAGNOSIS — I472 Ventricular tachycardia, unspecified: Secondary | ICD-10-CM | POA: Diagnosis present

## 2024-05-21 DIAGNOSIS — K30 Functional dyspepsia: Secondary | ICD-10-CM | POA: Diagnosis not present

## 2024-05-21 DIAGNOSIS — I2510 Atherosclerotic heart disease of native coronary artery without angina pectoris: Secondary | ICD-10-CM

## 2024-05-21 DIAGNOSIS — Z87891 Personal history of nicotine dependence: Secondary | ICD-10-CM

## 2024-05-21 DIAGNOSIS — R079 Chest pain, unspecified: Secondary | ICD-10-CM

## 2024-05-21 DIAGNOSIS — I119 Hypertensive heart disease without heart failure: Secondary | ICD-10-CM | POA: Diagnosis present

## 2024-05-21 DIAGNOSIS — Z9049 Acquired absence of other specified parts of digestive tract: Secondary | ICD-10-CM

## 2024-05-21 DIAGNOSIS — R519 Headache, unspecified: Secondary | ICD-10-CM | POA: Diagnosis not present

## 2024-05-21 DIAGNOSIS — E669 Obesity, unspecified: Secondary | ICD-10-CM | POA: Diagnosis present

## 2024-05-21 DIAGNOSIS — I214 Non-ST elevation (NSTEMI) myocardial infarction: Principal | ICD-10-CM

## 2024-05-21 HISTORY — PX: CORONARY STENT INTERVENTION: CATH118234

## 2024-05-21 HISTORY — PX: LEFT HEART CATH AND CORONARY ANGIOGRAPHY: CATH118249

## 2024-05-21 HISTORY — PX: CORONARY ULTRASOUND/IVUS: CATH118244

## 2024-05-21 LAB — POCT ACTIVATED CLOTTING TIME
Activated Clotting Time: 279 s
Activated Clotting Time: 279 s
Activated Clotting Time: 279 s
Activated Clotting Time: 285 s
Activated Clotting Time: 297 s

## 2024-05-21 LAB — ECHOCARDIOGRAM COMPLETE
AR max vel: 2.37 cm2
AV Area VTI: 2.81 cm2
AV Area mean vel: 2.23 cm2
AV Mean grad: 3 mmHg
AV Peak grad: 4.6 mmHg
Ao pk vel: 1.07 m/s
Area-P 1/2: 3.3 cm2
Height: 69 in
S' Lateral: 2.8 cm
Weight: 3732.8 [oz_av]

## 2024-05-21 LAB — LIPID PANEL
Cholesterol: 208 mg/dL — ABNORMAL HIGH (ref 0–200)
HDL: 33 mg/dL — ABNORMAL LOW (ref >40)
LDL Cholesterol: 113 mg/dL — ABNORMAL HIGH (ref 0–99)
Total CHOL/HDL Ratio: 6.3 ratio
Triglycerides: 311 mg/dL — ABNORMAL HIGH (ref <150)
VLDL: 62 mg/dL — ABNORMAL HIGH (ref 0–40)

## 2024-05-21 LAB — CBC
HCT: 39.3 % (ref 39.0–52.0)
Hemoglobin: 13.5 g/dL (ref 13.0–17.0)
MCH: 30.3 pg (ref 26.0–34.0)
MCHC: 34.4 g/dL (ref 30.0–36.0)
MCV: 88.3 fL (ref 80.0–100.0)
Platelets: 218 10*3/uL (ref 150–400)
RBC: 4.45 MIL/uL (ref 4.22–5.81)
RDW: 12 % (ref 11.5–15.5)
WBC: 6.5 10*3/uL (ref 4.0–10.5)
nRBC: 0 % (ref 0.0–0.2)

## 2024-05-21 LAB — TROPONIN I (HIGH SENSITIVITY): Troponin I (High Sensitivity): 174 ng/L (ref <18)

## 2024-05-21 LAB — HIV ANTIBODY (ROUTINE TESTING W REFLEX): HIV Screen 4th Generation wRfx: NONREACTIVE

## 2024-05-21 LAB — BASIC METABOLIC PANEL WITH GFR
Anion gap: 8 (ref 5–15)
BUN: 16 mg/dL (ref 6–20)
CO2: 23 mmol/L (ref 22–32)
Calcium: 8.7 mg/dL — ABNORMAL LOW (ref 8.9–10.3)
Chloride: 105 mmol/L (ref 98–111)
Creatinine, Ser: 1 mg/dL (ref 0.61–1.24)
GFR, Estimated: >60 mL/min (ref >60)
Glucose, Bld: 108 mg/dL — ABNORMAL HIGH (ref 70–99)
Potassium: 4 mmol/L (ref 3.5–5.1)
Sodium: 136 mmol/L (ref 135–145)

## 2024-05-21 LAB — HEMOGLOBIN A1C
Hgb A1c MFr Bld: 5.9 % — ABNORMAL HIGH (ref 4.8–5.6)
Mean Plasma Glucose: 122.63 mg/dL

## 2024-05-21 LAB — PROTIME-INR
INR: 1 (ref 0.8–1.2)
Prothrombin Time: 13.3 s (ref 11.4–15.2)

## 2024-05-21 LAB — MAGNESIUM: Magnesium: 2.6 mg/dL — ABNORMAL HIGH (ref 1.7–2.4)

## 2024-05-21 LAB — HEPARIN LEVEL (UNFRACTIONATED): Heparin Unfractionated: 0.36 [IU]/mL (ref 0.30–0.70)

## 2024-05-21 SURGERY — LEFT HEART CATH AND CORONARY ANGIOGRAPHY
Anesthesia: LOCAL

## 2024-05-21 MED ORDER — METOPROLOL SUCCINATE ER 25 MG PO TB24
25.0000 mg | ORAL_TABLET | Freq: Every day | ORAL | Status: DC
  Administered 2024-05-21 – 2024-05-22 (×2): 25 mg via ORAL
  Filled 2024-05-21 (×2): qty 1

## 2024-05-21 MED ORDER — IOHEXOL 350 MG/ML SOLN
INTRAVENOUS | Status: DC | PRN
  Administered 2024-05-21: 160 mL

## 2024-05-21 MED ORDER — ACETAMINOPHEN 325 MG PO TABS
ORAL_TABLET | ORAL | Status: AC
  Filled 2024-05-21: qty 1

## 2024-05-21 MED ORDER — HEPARIN SODIUM (PORCINE) 1000 UNIT/ML IJ SOLN
INTRAMUSCULAR | Status: AC
  Filled 2024-05-21: qty 10

## 2024-05-21 MED ORDER — PRASUGREL HCL 10 MG PO TABS
ORAL_TABLET | ORAL | Status: DC | PRN
  Administered 2024-05-21: 60 mg via ORAL

## 2024-05-21 MED ORDER — FENTANYL CITRATE PF 50 MCG/ML IJ SOSY
50.0000 ug | PREFILLED_SYRINGE | Freq: Once | INTRAMUSCULAR | Status: AC
  Administered 2024-05-21: 50 ug via INTRAVENOUS
  Filled 2024-05-21: qty 1

## 2024-05-21 MED ORDER — NITROGLYCERIN 1 MG/10 ML FOR IR/CATH LAB
INTRA_ARTERIAL | Status: AC
Start: 2024-05-21 — End: 2024-05-22
  Filled 2024-05-21: qty 10

## 2024-05-21 MED ORDER — LABETALOL HCL 5 MG/ML IV SOLN: 10.0000 mg | INTRAVENOUS | Status: AC | PRN

## 2024-05-21 MED ORDER — VERAPAMIL HCL 2.5 MG/ML IV SOLN
INTRAVENOUS | Status: DC | PRN
  Administered 2024-05-21 (×2): 10 mL via INTRA_ARTERIAL

## 2024-05-21 MED ORDER — HYDROCODONE-ACETAMINOPHEN 5-325 MG PO TABS
1.0000 | ORAL_TABLET | Freq: Once | ORAL | Status: AC
  Administered 2024-05-21: 1 via ORAL
  Filled 2024-05-21: qty 1

## 2024-05-21 MED ORDER — SODIUM CHLORIDE 0.9 % IV SOLN: 250.0000 mL | INTRAVENOUS | Status: DC | PRN

## 2024-05-21 MED ORDER — MIDAZOLAM HCL 2 MG/2ML IJ SOLN
INTRAMUSCULAR | Status: AC
Start: 2024-05-21 — End: ?
  Filled 2024-05-21: qty 2

## 2024-05-21 MED ORDER — PRASUGREL HCL 10 MG PO TABS
10.0000 mg | ORAL_TABLET | Freq: Every day | ORAL | Status: DC
  Administered 2024-05-22: 10 mg via ORAL
  Filled 2024-05-21: qty 1

## 2024-05-21 MED ORDER — ASPIRIN 81 MG PO CHEW: 81.0000 mg | CHEWABLE_TABLET | ORAL | Status: DC

## 2024-05-21 MED ORDER — NITROGLYCERIN 0.4 MG SL SUBL
0.4000 mg | SUBLINGUAL_TABLET | SUBLINGUAL | Status: DC | PRN
  Administered 2024-05-21: 0.4 mg via SUBLINGUAL
  Filled 2024-05-21: qty 1

## 2024-05-21 MED ORDER — HEPARIN (PORCINE) IN NACL 1000-0.9 UT/500ML-% IV SOLN
INTRAVENOUS | Status: DC | PRN
  Administered 2024-05-21 (×3): 500 mL

## 2024-05-21 MED ORDER — ACETAMINOPHEN 325 MG PO TABS
ORAL_TABLET | ORAL | Status: AC
Start: 2024-05-21 — End: 2024-05-22
  Filled 2024-05-21: qty 1

## 2024-05-21 MED ORDER — FENTANYL CITRATE (PF) 100 MCG/2ML IJ SOLN
INTRAMUSCULAR | Status: DC | PRN
  Administered 2024-05-21 (×2): 25 ug via INTRAVENOUS
  Administered 2024-05-21: 50 ug via INTRAVENOUS
  Administered 2024-05-21 (×2): 25 ug via INTRAVENOUS

## 2024-05-21 MED ORDER — SODIUM CHLORIDE 0.9% FLUSH
3.0000 mL | Freq: Two times a day (BID) | INTRAVENOUS | Status: DC
  Administered 2024-05-22: 3 mL via INTRAVENOUS

## 2024-05-21 MED ORDER — LIDOCAINE HCL (PF) 1 % IJ SOLN
INTRAMUSCULAR | Status: AC
  Filled 2024-05-21: qty 30

## 2024-05-21 MED ORDER — SODIUM CHLORIDE 0.9 % IV SOLN: INTRAVENOUS | Status: AC

## 2024-05-21 MED ORDER — MIDAZOLAM HCL 2 MG/2ML IJ SOLN
INTRAMUSCULAR | Status: AC
  Filled 2024-05-21: qty 2

## 2024-05-21 MED ORDER — FENTANYL CITRATE (PF) 100 MCG/2ML IJ SOLN
INTRAMUSCULAR | Status: AC
  Filled 2024-05-21: qty 2

## 2024-05-21 MED ORDER — VERAPAMIL HCL 2.5 MG/ML IV SOLN
INTRAVENOUS | Status: AC
Start: 2024-05-21 — End: ?
  Filled 2024-05-21: qty 2

## 2024-05-21 MED ORDER — NITROGLYCERIN 1 MG/10 ML FOR IR/CATH LAB
INTRA_ARTERIAL | Status: DC | PRN
Start: 2024-05-21 — End: 2024-05-21
  Administered 2024-05-21 (×4): 200 ug via INTRACORONARY

## 2024-05-21 MED ORDER — HYDRALAZINE HCL 20 MG/ML IJ SOLN: 10.0000 mg | INTRAMUSCULAR | Status: AC | PRN

## 2024-05-21 MED ORDER — ALUM & MAG HYDROXIDE-SIMETH 200-200-20 MG/5ML PO SUSP
30.0000 mL | ORAL | Status: DC | PRN
  Administered 2024-05-21: 30 mL via ORAL
  Filled 2024-05-21: qty 30

## 2024-05-21 MED ORDER — LIDOCAINE HCL (PF) 1 % IJ SOLN
INTRAMUSCULAR | Status: DC | PRN
Start: 2024-05-21 — End: 2024-05-21
  Administered 2024-05-21: 2 mL via INTRADERMAL

## 2024-05-21 MED ORDER — SODIUM CHLORIDE 0.9 % WEIGHT BASED INFUSION
1.0000 mL/kg/h | INTRAVENOUS | Status: DC
  Administered 2024-05-21: 1 mL/kg/h via INTRAVENOUS

## 2024-05-21 MED ORDER — SODIUM CHLORIDE 0.9 % WEIGHT BASED INFUSION
3.0000 mL/kg/h | INTRAVENOUS | Status: DC
  Administered 2024-05-21: 3 mL/kg/h via INTRAVENOUS

## 2024-05-21 MED ORDER — ENOXAPARIN SODIUM 40 MG/0.4ML IJ SOSY
40.0000 mg | PREFILLED_SYRINGE | INTRAMUSCULAR | Status: DC
  Administered 2024-05-22: 40 mg via SUBCUTANEOUS
  Filled 2024-05-21: qty 0.4

## 2024-05-21 MED ORDER — ONDANSETRON HCL 4 MG/2ML IJ SOLN
4.0000 mg | Freq: Once | INTRAMUSCULAR | Status: DC
  Filled 2024-05-21: qty 2

## 2024-05-21 MED ORDER — SODIUM CHLORIDE 0.9% FLUSH: 3.0000 mL | INTRAVENOUS | Status: DC | PRN

## 2024-05-21 MED ORDER — NITROGLYCERIN 1 MG/10 ML FOR IR/CATH LAB
INTRA_ARTERIAL | Status: AC
  Filled 2024-05-21: qty 10

## 2024-05-21 MED ORDER — MIDAZOLAM HCL 2 MG/2ML IJ SOLN
INTRAMUSCULAR | Status: DC | PRN
  Administered 2024-05-21 (×4): 1 mg via INTRAVENOUS

## 2024-05-21 MED ORDER — HEPARIN SODIUM (PORCINE) 1000 UNIT/ML IJ SOLN
INTRAMUSCULAR | Status: DC | PRN
Start: 2024-05-21 — End: 2024-05-21
  Administered 2024-05-21 (×3): 2000 [IU] via INTRAVENOUS
  Administered 2024-05-21: 5000 [IU] via INTRAVENOUS
  Administered 2024-05-21: 6000 [IU] via INTRAVENOUS

## 2024-05-21 MED ORDER — ASPIRIN 81 MG PO TBEC
81.0000 mg | DELAYED_RELEASE_TABLET | Freq: Every day | ORAL | Status: DC
  Administered 2024-05-21 – 2024-05-22 (×2): 81 mg via ORAL
  Filled 2024-05-21 (×2): qty 1

## 2024-05-21 MED ORDER — ACETAMINOPHEN 325 MG PO TABS
650.0000 mg | ORAL_TABLET | ORAL | Status: DC | PRN
  Administered 2024-05-21 (×2): 650 mg via ORAL
  Filled 2024-05-21: qty 2

## 2024-05-21 SURGICAL SUPPLY — 18 items
BALLOON EMERGE MR 2.5X12 (BALLOONS) IMPLANT
BALLOON SCOREFLEX 3.0X15 (BALLOONS) IMPLANT
BALLOON ~~LOC~~ EMERGE MR 3.25X15 (BALLOONS) IMPLANT
BALLOON ~~LOC~~ EMERGE MR 3.75X8 (BALLOONS) IMPLANT
CATH 5FR JL3.5 JR4 ANG PIG MP (CATHETERS) IMPLANT
CATH OPTICROSS HD (CATHETERS) IMPLANT
CATH VISTA GUIDE 6FR XBLD 3.5 (CATHETERS) IMPLANT
DEVICE RAD COMP TR BAND LRG (VASCULAR PRODUCTS) IMPLANT
DRAPE IVUS SLED (BAG) IMPLANT
GLIDESHEATH SLEND SS 6F .021 (SHEATH) IMPLANT
GUIDEWIRE INQWIRE 1.5J.035X260 (WIRE) IMPLANT
KIT ENCORE 26 ADVANTAGE (KITS) IMPLANT
PACK CARDIAC CATHETERIZATION (CUSTOM PROCEDURE TRAY) ×1 IMPLANT
SET ATX-X65L (MISCELLANEOUS) IMPLANT
STENT SYNERGY XD 3.0X48 (Permanent Stent) IMPLANT
STENT SYNERGY XD 3.50X16 (Permanent Stent) IMPLANT
WIRE ASAHI PROWATER 180CM (WIRE) IMPLANT
WIRE RUNTHROUGH .014X180CM (WIRE) IMPLANT

## 2024-05-21 NOTE — Progress Notes (Signed)
 Pt is complaining of chest discomfort, previously described as indigestion in feeling. Now is describing "pneumonia"/burning sensation. Attempted to notify on-call Cardiology.  Sonjia Durie, RN

## 2024-05-21 NOTE — Progress Notes (Signed)
*  PRELIMINARY RESULTS* Echocardiogram 2D Echocardiogram has been performed.  Glenna Lango 05/21/2024, 8:55 AM

## 2024-05-21 NOTE — Progress Notes (Signed)
 Received patient from [facility] ED via [facility]. Pt denies CP. Hep gtt on and running at 1300u/hr. VSS. PIV x2 present and working. No pre-cath IVF running. This RN initiated protocol. ASA 81mg  given at 0957.

## 2024-05-21 NOTE — Progress Notes (Signed)
 PHARMACY - ANTICOAGULATION CONSULT NOTE  Pharmacy Consult for heparin Indication: chest pain/ACS  Allergies  Allergen Reactions   Amoxicillin Nausea Only    Patient Measurements: Height: 5\' 9"  (175.3 cm) Weight: 105.8 kg (233 lb 4.8 oz) IBW/kg (Calculated) : 70.7 HEPARIN DW (KG): 93.6   Vital Signs: Temp: 98.1 F (36.7 C) (06/05 0512) Temp Source: Oral (06/05 0512) BP: 127/84 (06/05 0430) Pulse Rate: 65 (06/05 0430)  Labs: Recent Labs    05/20/24 1813 05/20/24 2030 05/21/24 0513  HGB 13.9  --  13.5  HCT 40.4  --  39.3  PLT 248  --  218  HEPARINUNFRC  --   --  0.36  CREATININE 1.16  --   --   TROPONINIHS 79* 114*  --     Estimated Creatinine Clearance: 85.2 mL/min (by C-G formula based on SCr of 1.16 mg/dL).   Medical History: Past Medical History:  Diagnosis Date   Kidney stones     Medications:  Infusions:   heparin 1,300 Units/hr (05/20/24 2309)    Assessment: 58 yo M who presented with chest pain.  Increasing troponin.  Pharmacy consulted to dose IV heparin for NSTEMI.  He is not on anticoagulation PTA Baseline CBC WNL  05/21/2024: Initial heparin level 0.36- therapeutic on IV heparin 1300 units/hr CBC: Hg, pltc WNL No bleeding or infusion related concerns reported by RN  Goal of Therapy:  Heparin level 0.3-0.7 units/ml Monitor platelets by anticoagulation protocol: Yes   Plan:  Continue IV heparin 1300 units/hr Check confirmatory heparin level at 1100 Daily heparin level & CBC while on heparin  Michelle Alireza Pollack PharmD 05/21/2024,5:32 AM

## 2024-05-21 NOTE — Progress Notes (Signed)
 [facility] Interventional Cardiology Note  Date: 05/21/24  Time: 4:56 PM  Patient evaluated post-PCI to ostial through mid LAD this afternoon.  His only complaint is of severe headache that began with NTG administration during PCI.  He received APAP ~15 min ago with only slight improvement.  He does not have any focal neurologic deficits by his report or on my exam.  He denies chest pain.  I will given fentanyl  50 mcg IV x 1 for pain.  Continue gentle hydration and close monitoring.  If HA does not improve or if he develops focal neuro deficits, STAT head CT will need to be obtained to exclude intracranial hemorrhage.  Sammy Crisp, MD [facility]

## 2024-05-21 NOTE — ED Notes (Addendum)
 Per cath lab leave hep gtt on until pt arrives at [facility], and skip hep gtt level draw bc hep is to be turned off at [facility]

## 2024-05-21 NOTE — ED Notes (Signed)
 Steady gait to restroom, wife at bedside, echo about to begin. Will collect labs when echo complete

## 2024-05-21 NOTE — Progress Notes (Signed)
   Notified that patient was having some chest pain/indigestion. Went in to evaluate the patient. He reports some 4-5/10 chest pain that he believes is getting better. He is unsure if NTG or sitting upright in the bed is what  helped with his pain. Updated 12 lead EKG shows no acute ischemic changes. RN administering Maalox and will monitor for resolution of symptoms   Jiles Mote, PA-C 05/21/2024 8:02 PM

## 2024-05-21 NOTE — Plan of Care (Signed)
   Problem: Education: Goal: Understanding of cardiac disease, CV risk reduction, and recovery process will improve Outcome: Progressing Goal: Individualized Educational Video(s) Outcome: Progressing   Problem: Activity: Goal: Ability to tolerate increased activity will improve Outcome: Progressing   Problem: Cardiac: Goal: Ability to achieve and maintain adequate cardiovascular perfusion will improve Outcome: Progressing   Problem: Health Behavior/Discharge Planning: Goal: Ability to safely manage health-related needs after discharge will improve Outcome: Progressing   Problem: Education: Goal: Understanding of CV disease, CV risk reduction, and recovery process will improve Outcome: Progressing Goal: Individualized Educational Video(s) Outcome: Progressing   Problem: Activity: Goal: Ability to return to baseline activity level will improve Outcome: Progressing   Problem: Cardiovascular: Goal: Ability to achieve and maintain adequate cardiovascular perfusion will improve Outcome: Progressing Goal: Vascular access site(s) Level 0-1 will be maintained Outcome: Progressing   Problem: Health Behavior/Discharge Planning: Goal: Ability to safely manage health-related needs after discharge will improve Outcome: Progressing   Problem: Education: Goal: Knowledge of General Education information will improve Description: Including pain rating scale, medication(s)/side effects and non-pharmacologic comfort measures Outcome: Progressing   Problem: Health Behavior/Discharge Planning: Goal: Ability to manage health-related needs will improve Outcome: Progressing   Problem: Clinical Measurements: Goal: Ability to maintain clinical measurements within normal limits will improve Outcome: Progressing Goal: Will remain free from infection Outcome: Progressing Goal: Diagnostic test results will improve Outcome: Progressing Goal: Respiratory complications will improve Outcome:  Progressing Goal: Cardiovascular complication will be avoided Outcome: Progressing   Problem: Activity: Goal: Risk for activity intolerance will decrease Outcome: Progressing   Problem: Nutrition: Goal: Adequate nutrition will be maintained Outcome: Progressing   Problem: Coping: Goal: Level of anxiety will decrease Outcome: Progressing   Problem: Elimination: Goal: Will not experience complications related to bowel motility Outcome: Progressing Goal: Will not experience complications related to urinary retention Outcome: Progressing   Problem: Pain Managment: Goal: General experience of comfort will improve and/or be controlled Outcome: Progressing   Problem: Safety: Goal: Ability to remain free from injury will improve Outcome: Progressing   Problem: Skin Integrity: Goal: Risk for impaired skin integrity will decrease Outcome: Progressing

## 2024-05-21 NOTE — ED Notes (Signed)
 Patient is resting comfortably.

## 2024-05-21 NOTE — Interval H&P Note (Signed)
 History and Physical Interval Note:  05/21/2024 12:47 PM  Sharon Stapel  has presented today for surgery, with the diagnosis of NSTEMI.  The various methods of treatment have been discussed with the patient and family. After consideration of risks, benefits and other options for treatment, the patient has consented to  Procedure(s): LEFT HEART CATH AND CORONARY ANGIOGRAPHY (N/A) as a surgical intervention.  The patient's history has been reviewed, patient examined, no change in status, stable for surgery.  I have reviewed the patient's chart and labs.  Questions were answered to the patient's satisfaction.    Cath Lab Visit (complete for each Cath Lab visit)  Clinical Evaluation Leading to the Procedure:   ACS: Yes.    Non-ACS:  N/A  Denea Cheaney

## 2024-05-21 NOTE — H&P (Addendum)
 Cardiology Admission History and Physical   Patient ID: Joseph Kim MRN: [id number]; DOB: 1966/09/01   Admission date: 05/20/2024  PCP:  Patient, No Pcp Per   [facility] Providers Cardiologist:  New to Dr Emmette Harms (saw Dr Audery Blazing 2015)  Chief Complaint:  chest pain  Patient Profile: [name] is a 58 y.o. male with hx of appendectomy in 2022 who is being seen 05/21/2024 for the evaluation of chest pain.  History of Present Illness:  [name] presented to [facility] ER on 05/20/24 afternoon complaining chest pain.  Patient reported that pain has been going on for several weeks.  He initially thought this was due to indigestion. Pain is located at mid-sternum area, sometime radiating to shoulder and neck area.  He noted pain is worsened with exertion or when he lays flat. He was not able to sleep flat for the past few nights due to chest pain and SOB. He noted walking or handing work related task had been difficult due to chest pain lately. He is chest pain free currently.   He denied any significant dizziness, syncope, nausea, vomiting, paresthesia, weight gain, leg edema.  He denied a personal history of MI, diabetes, tobacco use, ETOH use, illicit drug use, stroke.  He reported his father had MI at the age of 54s.   Per ER workup, BMP with creatinine 1.16 and BUN 22.  BNP 24.4.  High sensitive troponin I 79 >114.  CBC grossly unremarkable.  Chest x-ray revealed no acute finding.  EKG reveals sinus rhythm 85 bpm, poor R wave progression, nonspecific lateral T wave abnormality.  He was concerned for non-STEMI at ED. case was reviewed with overnight fellow from cardiology, and he was accepted for transfer to [facility] for [facility] under cardiology.  He was started on heparin drip and Crestor 20 mg daily.  Per chart review, patient saw Dr. Audery Blazing in 2015 for chest pain.  He had negative troponin and EKG change at the time.  ETT from 02/16/2014 revealed excellent exercise tolerance and no  exercise-induced ischemic EKG change.  He has no additional cardiac workup in the past.   Past Medical History:  Diagnosis Date   Kidney stones    Past Surgical History:  Procedure Laterality Date   TONSILLECTOMY       Medications Prior to Admission: Prior to Admission medications   Medication Sig Start Date End Date Taking? Authorizing Provider  ibuprofen (ADVIL,MOTRIN) 200 MG tablet Take 400 mg by mouth every 6 (six) hours as needed for mild pain.   Yes [provider]  magnesium 30 MG tablet Take 30 mg by mouth daily.   Yes [provider]  Misc Natural Products (BEET ROOT) 500 MG CAPS Take 2 capsules by mouth daily.   Yes [provider]  ketorolac  (TORADOL ) 10 MG tablet Take 1 tablet (10 mg total) by mouth every 6 (six) hours as needed. Patient not taking: Reported on 05/20/2024 03/08/15   Evone Hoh, MD  ondansetron  (ZOFRAN  ODT) 4 MG disintegrating tablet 4mg  ODT q4 hours prn nausea/vomit Patient not taking: Reported on 05/20/2024 03/08/15   Evone Hoh, MD  oxyCODONE -acetaminophen  (PERCOCET) 5-325 MG per tablet Take 1-2 tablets by mouth every 4 (four) hours as needed for moderate pain or severe pain. Patient not taking: Reported on 05/20/2024 03/08/15   Evone Hoh, MD  tamsulosin  (FLOMAX ) 0.4 MG CAPS capsule Take 1 capsule (0.4 mg total) by mouth daily. Patient not taking: Reported on 05/20/2024 03/08/15   Evone Hoh, MD  Allergies:    Allergies  Allergen Reactions   Amoxicillin Nausea Only    Social History:   Social History   Socioeconomic History   Marital status: Married    Spouse name: Not on file   Number of children: 3   Years of education: Not on file   Highest education level: Not on file  Occupational History    Employer: [employer]  Tobacco Use   Smoking status: Former   Smokeless tobacco: Never  Substance and Sexual Activity   Alcohol use: Yes    Comment: rarely   Drug use: No   Sexual activity: Not  on file  Other Topics Concern   Not on file  Social History Narrative   Not on file   Social Drivers of Health   Financial Resource Strain: Not on file  Food Insecurity: Not on file  Transportation Needs: Not on file  Physical Activity: Not on file  Stress: Not on file  Social Connections: Not on file  Intimate Partner Violence: Not on file     Family History:   Father: MI at age of 36s  ROS:  Constitutional: Denied fever, chills, malaise, night sweats Eyes: Denied vision change or loss Ears/Nose/Mouth/Throat: Denied ear ache, sore throat, coughing, sinus pain Cardiovascular: see HPI Respiratory: denied shortness of breath Gastrointestinal: Denied nausea, vomiting, abdominal pain, diarrhea Genital/Urinary: Denied dysuria, hematuria, urinary frequency/urgency Musculoskeletal: Denied muscle ache, joint pain, weakness Skin: Denied rash, wound Neuro: Denied headache, dizziness, syncope Psych: Denied history of depression/anxiety  Endocrine: Denied history of diabetes     Physical Exam/Data: Vitals:   05/21/24 0645 05/21/24 0700 05/21/24 0730 05/21/24 0740  BP: (!) 140/85 124/75 127/89   Pulse: 86 75 66   Resp: 13 (!) 21 15   Temp:    98 F (36.7 C)  TempSrc:    Oral  SpO2: 93% 93% 92%   Weight:      Height:       No intake or output data in the 24 hours ending 05/21/24 0819    05/20/2024   10:45 PM 03/12/2018    7:13 PM 02/04/2014    2:43 PM  Last 3 Weights  Weight (lbs) 233 lb 4.8 oz 213 lb 214 lb  Weight (kg) 105.824 kg 96.616 kg 97.07 kg     Body mass index is 34.45 kg/m.   Vitals:  Vitals:   05/21/24 0730 05/21/24 0740  BP: 127/89   Pulse: 66   Resp: 15   Temp:  98 F (36.7 C)  SpO2: 92%    General Appearance: In no apparent distress, laying in bed, well nourished  HEENT: Normocephalic, atraumatic.  Neck: Supple, trachea midline, no JVDs Cardiovascular: Regular rate and rhythm, normal S1-S2,  no murmur Respiratory: Resting breathing unlabored,  lungs sounds clear to auscultation bilaterally, no use of accessory muscles. On room air.  No wheezes, rales or rhonchi.   Gastrointestinal: Bowel sounds positive, abdomen soft, non-tender, non-distended.  Extremities: Able to move all extremities in bed without difficulty, no leg edema Musculoskeletal: Normal muscle bulk and tone Skin: Intact, warm, dry. No rashes or petechiae noted in exposed areas.  Neurologic: Alert, oriented to person, place and time. Fluent speech, no cognitive deficit, no gross focal neuro deficit Psychiatric: Normal affect. Mood is appropriate.     EKG:    EKG from 05/20/24 at 1758 reveals sinus rhythm 85 bpm, nonspecific T wave abnormality of lateral leads, poor R wave progression  EKG from 05/21/2024 at 646 revealed  sinus rhythm 87 bpm, unchanged nonspecific T wave abnormality of lateral leads, poor R wave progression  Relevant CV Studies:   ETT 02/16/2014:  The patient exercised according to the CVN-BRUCE for 11:00 min:s, achieving a work level of Max. METS: 13.4. The resting heart rate of 95 bpm rose to a maximal heart rate of 184 bpm. This value represents 105 % of the maximal, age-predicted heart rate. The resting blood pressure of 121/87 mmHg , rose to a maximum blood pressure of 182/130 mmHg. The exercise test was stopped due to SOB, LEG DISCOMFORT.  Interpretation  Conclusions  Excellent exercise tolerance for age Normal BP response to exercise No exercise induced ischemic EKG changes.  Laboratory Data: High Sensitivity Troponin:   Recent Labs  Lab 05/20/24 1813 05/20/24 2030  TROPONINIHS 79* 114*      Chemistry Recent Labs  Lab 05/20/24 1813  NA 137  K 3.7  CL 105  CO2 22  GLUCOSE 87  BUN 22*  CREATININE 1.16  CALCIUM 8.8*  GFRNONAA >60  ANIONGAP 10    No results for input(s): "PROT", "ALBUMIN", "AST", "ALT", "ALKPHOS", "BILITOT" in the last 168 hours. Lipids No results for input(s): "CHOL", "TRIG", "HDL", "LABVLDL", "LDLCALC",  "CHOLHDL" in the last 168 hours. Hematology Recent Labs  Lab 05/20/24 1813 05/21/24 0513  WBC 8.4 6.5  RBC 4.59 4.45  HGB 13.9 13.5  HCT 40.4 39.3  MCV 88.0 88.3  MCH 30.3 30.3  MCHC 34.4 34.4  RDW 12.0 12.0  PLT 248 218   Thyroid No results for input(s): "TSH", "FREET4" in the last 168 hours. BNP Recent Labs  Lab 05/20/24 1813  BNP 24.4    DDimer No results for input(s): "DDIMER" in the last 168 hours.  Radiology/Studies:  DG Chest 2 View Result Date: 05/20/2024 CLINICAL DATA:  Chest pain. EXAM: CHEST - 2 VIEW COMPARISON:  Chest radiograph dated 01/17/2014. FINDINGS: The heart size and mediastinal contours are within normal limits. Both lungs are clear. The visualized skeletal structures are unremarkable. IMPRESSION: No active cardiopulmonary disease. Electronically Signed   By: Angus Bark M.D.   On: 05/20/2024 18:29     Assessment and Plan:  NSTEMI - Presented with several weeks onset of exertional chest pain, sometimes worsened by laying flat, associated with SOB - High sensitive troponin 79>114 - EKG with nonspecific T wave flattening of lateral leads - Chest x-ray revealed no acute finding - Will admit to telemetry unit at [facility]  - Will trend troponin and check BMP, magnesium, INR, hemoglobin A1c, and lipid profile today - Will check echocardiogram today - NPO, plan for cardiac cath today, he is agreeable  - Medical therapy: start ASA 81mg  daily, continue heparin gtt, continue crestor 20mg , start metoprolol XL 25mg  daily, further change pending cath finding   Elevated blood pressure - Blood pressure initially elevated at ED, appears to controlled now, continue to trend  DVT prophylaxis - Currently on heparin infusion  AD: Full code, POA is his wife Ms Abdel Effinger  Dispo: Likely in 24 to 48 hours to home    Informed Consent   Shared Decision Making/Informed Consent{  The risks [stroke (1 in 1000), death (1 in 1000), kidney failure  [usually temporary] (1 in 500), bleeding (1 in 200), allergic reaction [possibly serious] (1 in 200)], benefits (diagnostic support and management of coronary artery disease) and alternatives of a cardiac catheterization were discussed in detail with Mr. Goshert and he is willing to proceed.     Risk  Assessment/Risk Scores:  TIMI Risk Score for Unstable Angina or Non-ST Elevation MI:   The patient's TIMI risk score is 2, which indicates a 8% risk of all cause mortality, new or recurrent myocardial infarction or need for urgent revascularization in the next 14 days.{      Code Status: Full Code  Severity of Illness: The appropriate patient status for this patient is OBSERVATION. Observation status is judged to be reasonable and necessary in order to provide the required intensity of service to ensure the patient's safety. The patient's presenting symptoms, physical exam findings, and initial radiographic and laboratory data in the context of their medical condition is felt to place them at decreased risk for further clinical deterioration. Furthermore, it is anticipated that the patient will be medically stable for discharge from the hospital within 2 midnights of admission.   For questions or updates, please contact [facility] Please consult [email or web address] for contact info under     Signed, Xika Zhao, NP  05/21/2024 8:19 AM   Patient seen and examined, note reviewed with the signed Advanced Practice Provider. I personally reviewed laboratory data, imaging studies and relevant notes. I independently examined the patient and formulated the important aspects of the plan. I have personally discussed the plan with the patient and/or family. Comments or changes to the note/plan are indicated below.  Patient seen and family at his bedside.  His wife was at the bedside during the time of my visit.  GEN:  Well nourished, well developed in no acute distress HEENT: Mucous membranes moist, good  dentition NECK: No JVD; No carotid bruits LYMPHATICS: No lymphadenopathy CARDIAC: S1S2 noted, RRR, no murmurs, rubs, gallops RESPIRATORY:  Clear to auscultation without rales, wheezing or rhonchi  ABDOMEN: Soft, non-tender, non-distended, bowel sounds noted, no guarding EXTREMITIES:No cyanosis, no cyanosis, no clubbing MUSCULOSKELETAL: No deformity  SKIN: Warm and dry NEUROLOGIC:  Alert and oriented x 3, nonfocal PSYCHIATRIC:  Normal affect, good insight   Assessment and plan  NSTEMI Hypertension/hypertensive heart disease Hyperlipidemia Obesity   His symptoms is concerning for atypical angina and in the setting of his mild trending upward troponin, family history of coronary artery disease it is in his best interest clinically for also pursue ischemic evaluation in this patient a left heart catheterization will be appropriate at this time.  In the meantime he has been started on heparin drip continue that, aspirin  81 mg daily, his lipid panel today reflects dyslipidemia he has been started on Crestor 20 mg daily we will keep that.  And add beta-blocker as well: Okay with Toprol XL 25 mg daily.  He was hypertensive on arrival blood pressure has improved we will continue to monitor and adjust antihypertensive medication as appropriate.  Hemoglobin A1c is pending.  Echocardiogram will also be done to assess LV/RV function and any other structure abnormalities.  Was done this morning quick review EF looks normal will defer to the report for other significant findings.   Informed Consent   Shared Decision Making/Informed Consent The risks [stroke (1 in 1000), death (1 in 1000), kidney failure [usually temporary] (1 in 500), bleeding (1 in 200), allergic reaction [possibly serious] (1 in 200)], benefits (diagnostic support and management of coronary artery disease) and alternatives of a cardiac catheterization were discussed in detail with Mr. Schaum and he is willing to proceed.        Marticia Reifschneider DO, MS [facility] Attending Cardiologist [facility]  [address] [place], [place] [address] [phone]  [phone] Website: [email or web address]

## 2024-05-21 NOTE — Progress Notes (Signed)
 Pt c/o CP/indigestion, scored 5/10, SL Nitroglycerin  administered, 12-lead EKG obtained, attempted to notify Parcells, PA. Unable to discern if symptoms were alleviated with elevated HOB or with Nitroglycerin . PRN Maalox ordered & administered.  Sonjia Durie, RN

## 2024-05-22 ENCOUNTER — Other Ambulatory Visit (HOSPITAL_COMMUNITY): Payer: Self-pay

## 2024-05-22 ENCOUNTER — Telehealth: Payer: Self-pay | Admitting: Cardiology

## 2024-05-22 DIAGNOSIS — I10 Essential (primary) hypertension: Secondary | ICD-10-CM | POA: Insufficient documentation

## 2024-05-22 DIAGNOSIS — E785 Hyperlipidemia, unspecified: Secondary | ICD-10-CM | POA: Insufficient documentation

## 2024-05-22 LAB — CBC
HCT: 38.9 % — ABNORMAL LOW (ref 39.0–52.0)
Hemoglobin: 13.3 g/dL (ref 13.0–17.0)
MCH: 30.2 pg (ref 26.0–34.0)
MCHC: 34.2 g/dL (ref 30.0–36.0)
MCV: 88.2 fL (ref 80.0–100.0)
Platelets: 222 10*3/uL (ref 150–400)
RBC: 4.41 MIL/uL (ref 4.22–5.81)
RDW: 11.9 % (ref 11.5–15.5)
WBC: 10.1 10*3/uL (ref 4.0–10.5)
nRBC: 0 % (ref 0.0–0.2)

## 2024-05-22 LAB — BASIC METABOLIC PANEL WITH GFR
Anion gap: 8 (ref 5–15)
BUN: 11 mg/dL (ref 6–20)
CO2: 23 mmol/L (ref 22–32)
Calcium: 8.6 mg/dL — ABNORMAL LOW (ref 8.9–10.3)
Chloride: 104 mmol/L (ref 98–111)
Creatinine, Ser: 1.26 mg/dL — ABNORMAL HIGH (ref 0.61–1.24)
GFR, Estimated: >60 mL/min (ref >60)
Glucose, Bld: 113 mg/dL — ABNORMAL HIGH (ref 70–99)
Potassium: 4 mmol/L (ref 3.5–5.1)
Sodium: 135 mmol/L (ref 135–145)

## 2024-05-22 MED ORDER — ASPIRIN 81 MG PO TBEC
81.0000 mg | DELAYED_RELEASE_TABLET | Freq: Every day | ORAL | 2 refills | Status: AC
  Filled 2024-05-22 – 2024-06-18 (×2): qty 30, 30d supply, fill #0

## 2024-05-22 MED ORDER — PRASUGREL HCL 10 MG PO TABS
10.0000 mg | ORAL_TABLET | Freq: Every day | ORAL | 2 refills | Status: DC
  Filled 2024-05-22: qty 30, 30d supply, fill #0

## 2024-05-22 MED ORDER — METOPROLOL SUCCINATE ER 50 MG PO TB24
50.0000 mg | ORAL_TABLET | Freq: Every day | ORAL | 1 refills | Status: DC
  Filled 2024-05-22: qty 30, 30d supply, fill #0

## 2024-05-22 MED ORDER — NITROGLYCERIN 0.4 MG SL SUBL
0.4000 mg | SUBLINGUAL_TABLET | SUBLINGUAL | 2 refills | Status: DC | PRN
  Filled 2024-05-22: qty 25, 7d supply, fill #0

## 2024-05-22 MED ORDER — METOPROLOL SUCCINATE ER 50 MG PO TB24: 50.0000 mg | ORAL_TABLET | Freq: Every day | ORAL | Status: DC

## 2024-05-22 MED ORDER — ROSUVASTATIN CALCIUM 20 MG PO TABS
20.0000 mg | ORAL_TABLET | Freq: Every day | ORAL | 1 refills | Status: DC
  Filled 2024-05-22: qty 30, 30d supply, fill #0

## 2024-05-22 NOTE — Progress Notes (Signed)
  Progress Note  Patient Name: Carlito Bogert Date of Encounter: 05/22/2024 [facility] Cardiologist: None new- Dr Emmette Harms  Interval Summary   Doing well this am. Ambulating with cardiac rehab. No significant angina.  Vital Signs Vitals:   05/22/24 0321 05/22/24 0623 05/22/24 0815 05/22/24 0908  BP: 133/76  (!) 152/84 (!) 152/84  Pulse: 74   74  Resp: 19  20   Temp: 97.7 F (36.5 C)  97.7 F (36.5 C)   TempSrc: Oral  Oral   SpO2: 95%  97%   Weight:  100.4 kg    Height:        Intake/Output Summary (Last 24 hours) at 05/22/2024 0959 Last data filed at 05/22/2024 0800 Gross per 24 hour  Intake 1008.23 ml  Output --  Net 1008.23 ml      05/22/2024    6:23 AM 05/20/2024   10:45 PM 03/12/2018    7:13 PM  Last 3 Weights  Weight (lbs) 221 lb 5.5 oz 233 lb 4.8 oz 213 lb  Weight (kg) 100.4 kg 105.824 kg 96.616 kg      Telemetry/ECG  NSR. One PVC triplet and one 7 beat run NSVT - Personally Reviewed  Physical Exam  GEN: No acute distress.   Neck: No JVD Cardiac: RRR, no murmurs, rubs, or gallops.  Respiratory: Clear to auscultation bilaterally. GI: Soft, nontender, non-distended  MS: No edema. Radial site without hematoma  Assessment & Plan  NSTEMI. Peak troponin 174. Normal EF. S/p cardiac cath with critical ostial/proximal LAD stenosis. S/p successful PCI with DES. Needs DAPT for one year. LDL 113 with high trig. Counseled on heart healthy diet. High dose statin. On Toprol XL for BP. Consider ARB but will need to check renal function on follow up before starting.  He is stable for DC today    For questions or updates, please contact [facility] Please consult [email or web address] for contact info under       Signed, Kaytlyn Din [place], MD

## 2024-05-22 NOTE — Progress Notes (Signed)
 CARDIAC REHAB PHASE I   PRE:  Rate/Rhythm: 78 SR    BP: sitting 144/96    SpO2:   MODE:  Ambulation: 410 ft   POST:  Rate/Rhythm: 88 SR    BP: sitting 187/83     SpO2:   Tolerated well, no major c/o. BP elevated. Discussed with pt and wife MI, stent, restrictions, diet, exercise, NTG, and CRPII. Pt voiced reception, wife with appropriate questions. Will refer to [facility].  [id number]   German Koller BS, ACSM-CEP 05/22/2024 10:26 AM

## 2024-05-22 NOTE — TOC CM/SW Note (Signed)
 Transition of Care [facility]) - Inpatient Brief Assessment   Patient Details  Name: Joseph Kim MRN: [id number] Date of Birth: [birth year]  Transition of Care [facility]) CM/SW Contact:    Juliane Och, LCSW Phone Number: 05/22/2024, 9:15 AM   Clinical Narrative:  9:16 AM Per chart review, patient resides at home with spouse. Patient does not have a PCP or insurance. CSW consulted RNCM and financial counseling for further assistance. Patient does not have SNF/HH/DME history. TOC will continue to follow.  Transition of Care Asessment: Insurance and Status: Selfpay Patient has primary care physician: No Home environment has been reviewed: Private Residence Prior level of function:: N/A Prior/Current Home Services: No current home services Social Drivers of Health Review: SDOH reviewed no interventions necessary Readmission risk has been reviewed: Yes Transition of care needs: transition of care needs identified, TOC will continue to follow

## 2024-05-22 NOTE — Plan of Care (Incomplete)
  Problem: Education: Goal: Understanding of cardiac disease, CV risk reduction, and recovery process will improve Outcome: Adequate for Discharge Goal: Individualized Educational Video(s) Outcome: Adequate for Discharge   Problem: Activity: Goal: Ability to tolerate increased activity will improve Outcome: Adequate for Discharge   Problem: Cardiac: Goal: Ability to achieve and maintain adequate cardiovascular perfusion will improve Outcome: Adequate for Discharge   Problem: Health Behavior/Discharge Planning: Goal: Ability to safely manage health-related needs after discharge will improve Outcome: Adequate for Discharge   Problem: Education: Goal: Understanding of CV disease, CV risk reduction, and recovery process will improve Outcome: Adequate for Discharge Goal: Individualized Educational Video(s) Outcome: Adequate for Discharge   Problem: Activity: Goal: Ability to return to baseline activity level will improve Outcome: Adequate for Discharge   Problem: Cardiovascular: Goal: Ability to achieve and maintain adequate cardiovascular perfusion will improve Outcome: Adequate for Discharge Goal: Vascular access site(s) Level 0-1 will be maintained Outcome: Adequate for Discharge   Problem: Health Behavior/Discharge Planning: Goal: Ability to safely manage health-related needs after discharge will improve Outcome: Adequate for Discharge   Problem: Education: Goal: Knowledge of General Education information will improve Description: Including pain rating scale, medication(s)/side effects and non-pharmacologic comfort measures Outcome: Adequate for Discharge   Problem: Health Behavior/Discharge Planning: Goal: Ability to manage health-related needs will improve Outcome: Adequate for Discharge   Problem: Clinical Measurements: Goal: Ability to maintain clinical measurements within normal limits will improve Outcome: Adequate for Discharge Goal: Will remain free from  infection Outcome: Adequate for Discharge Goal: Diagnostic test results will improve Outcome: Adequate for Discharge Goal: Respiratory complications will improve Outcome: Adequate for Discharge Goal: Cardiovascular complication will be avoided Outcome: Adequate for Discharge   Problem: Activity: Goal: Risk for activity intolerance will decrease Outcome: Adequate for Discharge   Problem: Nutrition: Goal: Adequate nutrition will be maintained Outcome: Adequate for Discharge   Problem: Coping: Goal: Level of anxiety will decrease Outcome: Adequate for Discharge   Problem: Elimination: Goal: Will not experience complications related to bowel motility Outcome: Adequate for Discharge Goal: Will not experience complications related to urinary retention Outcome: Adequate for Discharge   Problem: Pain Managment: Goal: General experience of comfort will improve and/or be controlled Outcome: Adequate for Discharge   Problem: Safety: Goal: Ability to remain free from injury will improve Outcome: Adequate for Discharge   Problem: Skin Integrity: Goal: Risk for impaired skin integrity will decrease Outcome: Adequate for Discharge

## 2024-05-22 NOTE — Telephone Encounter (Addendum)
 Pt discharged from [facility] today.  Triage nursing to place [facility] call to the pt on Monday 05/25/24.

## 2024-05-22 NOTE — Discharge Summary (Signed)
 Discharge Summary   Patient ID: Joseph Kim MRN: [id number]; DOB: Aug 05, 1966  Admit date: 05/20/2024 Discharge date: 05/22/2024  PCP:  No primary care provider on file.   [facility] Providers Cardiologist:  Kardie Tobb, DO   Discharge Diagnoses  Principal Problem:   NSTEMI (non-ST elevated myocardial infarction) [facility]) Active Problems:   Hypertension   Hyperlipidemia  Diagnostic Studies/Procedures   Cath: 05/21/2024  Conclusions: Severe, single-vessel coronary artery disease with multifocal ostial through mid LAD disease of 70-95%.  20% ostial LMCA and 10-20% proximal RCA stenoses are also present. Upper normal left ventricular filling pressure (LVEDP 15 mmHg). Successful IVUS-guided PCI to ostial through mid LAD with overlapping Synergy XD 3.5 x 16 mm (proximal) and 3.0 x 48 mm (distal) drug-eluting stents with 0% residual stenosis and TIMI-3 flow.   Recommendations: Dual antiplatelet therapy with aspirin  and prasugrel for at least 12 months. Aggressive secondary prevention of coronary artery disease.   Sammy Crisp, MD  Diagnostic Dominance: Right  Intervention   _____________   History of Present Illness   Joseph Kim is a 58 y.o. male with  hx of appendectomy in 2022 who is being seen 05/21/2024 for the evaluation of chest pain. Patient reported that pain has been going on for several weeks.  He initially thought this was due to indigestion. Pain was located at mid-sternum area, sometime radiating to shoulder and neck area.  He noted pain was worsened with exertion or when he lays flat. He was not able to sleep flat for the past few nights due to chest pain and SOB. He noted walking or handing work related task had been difficult due to chest pain lately. He is chest pain free currently.   He denied any significant dizziness, syncope, nausea, vomiting, paresthesia, weight gain, leg edema.  He denied a personal history of MI, diabetes, tobacco use, ETOH use, illicit  drug use, stroke.  He reported his father had MI at the age of 27s.    Per ER workup, BMP with creatinine 1.16 and BUN 22.  BNP 24.4.  High sensitive troponin I 79 >114.  CBC grossly unremarkable.  Chest x-ray revealed no acute finding.  EKG reveals sinus rhythm 85 bpm, poor R wave progression, nonspecific lateral T wave abnormality.  He was concerned for non-STEMI at ED. case was reviewed with overnight fellow from cardiology, and he was accepted for transfer to [facility] for admission under cardiology.  He was started on heparin drip and Crestor 20 mg daily.   Per chart review, patient saw Dr. Audery Blazing in 2015 for chest pain.  He had negative troponin and EKG change at the time.  ETT from 02/16/2014 revealed excellent exercise tolerance and no exercise-induced ischemic EKG change.  He has no additional cardiac workup in the past. [facility]>   Hospital Course    NSTEMI -- Presented with several weeks onset of exertional chest pain, sometimes worsened by laying flat, associated with SOB. High sensitive troponin 79>114. EKG with nonspecific T wave flattening of lateral leads -- Underwent cardiac catheterization 6/5 with severe single-vessel CAD throughout the mid LAD with successful IVUS guided PCI to the ostial/mid LAD with overlapping DES x 2.  Recommendations were DAPT with aspirin /Effient for at least 1 year. --Echocardiogram with LVEF of 65 to 70%, grade 1 diastolic dysfunction, normal RV, no significant valvular disease. -- continue crestor 20mg , metoprolol XL 25mg  daily   Hypertension -- Improved with the addition of  metoprolol XL.  Consider adding ARB once recheck of creatinine in the outpatient setting.  HLD -- LDL 113, HDL 33 -- continue Crestor 20mg  daily -- will need FLP/LFTs in 8 weeks  Patient seen by Dr. Sw[place] and deemed stable for discharge home.  Follow-up arranged in the office.  Medication sent to [facility] pharmacy.  Educated by  [employer].D. prior to discharge.   Did the patient have an acute coronary syndrome (MI, NSTEMI, STEMI, etc) this admission?:  Yes                               AHA/ACC ACS Clinical Performance & Quality Measures: Aspirin  prescribed? - Yes ADP Receptor Inhibitor (Plavix/Clopidogrel, Brilinta/Ticagrelor or Effient/Prasugrel) prescribed (includes medically managed patients)? - Yes Beta Blocker prescribed? - Yes High Intensity Statin (Lipitor 40-80mg  or Crestor 20-40mg ) prescribed? - Yes EF assessed during THIS hospitalization? - Yes For EF <40%, was ACEI/ARB prescribed? - Not Applicable (EF >/= 40%) For EF <40%, Aldosterone Antagonist (Spironolactone or Eplerenone) prescribed? - Not Applicable (EF >/= 40%) Cardiac Rehab Phase II ordered (including medically managed patients)? - Yes    The patient will be scheduled for a TOC follow up appointment in 10-14 days.  A message has been sent to the [facility] and Scheduling Pool at the office where the patient should be seen for follow up.  _____________  Discharge Vitals Blood pressure (!) 154/103, pulse 74, temperature 97.8 F (36.6 C), temperature source Oral, resp. rate 20, height 5\' 9"  (1.753 m), weight 100.4 kg, SpO2 97%.  Filed Weights   05/20/24 2245 05/22/24 0623  Weight: 105.8 kg 100.4 kg    Labs & Radiologic Studies  CBC Recent Labs    05/21/24 0513 05/22/24 0319  WBC 6.5 10.1  HGB 13.5 13.3  HCT 39.3 38.9*  MCV 88.3 88.2  PLT 218 222   Basic Metabolic Panel Recent Labs    96/04/54 0815 05/22/24 0319  NA 136 135  K 4.0 4.0  CL 105 104  CO2 23 23  GLUCOSE 108* 113*  BUN 16 11  CREATININE 1.00 1.26*  CALCIUM 8.7* 8.6*  MG 2.6*  --    Liver Function Tests No results for input(s): "AST", "ALT", "ALKPHOS", "BILITOT", "PROT", "ALBUMIN" in the last 72 hours. No results for input(s): "LIPASE", "AMYLASE" in the last 72 hours. High Sensitivity Troponin:   Recent Labs  Lab 05/20/24 1813 05/20/24 2030 05/21/24 0815   TROPONINIHS 79* 114* 174*    No results for input(s): "TRNPT" in the last 720 hours.  BNP Invalid input(s): "POCBNP" No results for input(s): "PROBNP" in the last 72 hours.  Recent Labs    05/20/24 1813  BNP 24.4    D-Dimer No results for input(s): "DDIMER" in the last 72 hours. Hemoglobin A1C Recent Labs    05/21/24 0850  HGBA1C 5.9*   Fasting Lipid Panel Recent Labs    05/21/24 0815  CHOL 208*  HDL 33*  LDLCALC 113*  TRIG 311*  CHOLHDL 6.3   No results found for: "LIPOA"  Thyroid Function Tests No results for input(s): "TSH", "T4TOTAL", "T3FREE", "THYROIDAB" in the last 72 hours.  Invalid input(s): "FREET3" _____________  CARDIAC CATHETERIZATION Result Date: 05/21/2024 Conclusions: Severe, single-vessel coronary artery disease with multifocal ostial through mid LAD disease of 70-95%.  20% ostial LMCA and 10-20% proximal RCA stenoses are also present. Upper normal left ventricular filling pressure (LVEDP 15 mmHg). Successful IVUS-guided PCI to ostial through mid  LAD with overlapping Synergy XD 3.5 x 16 mm (proximal) and 3.0 x 48 mm (distal) drug-eluting stents with 0% residual stenosis and TIMI-3 flow. Recommendations: Dual antiplatelet therapy with aspirin  and prasugrel for at least 12 months. Aggressive secondary prevention of coronary artery disease. Sammy Crisp, MD [facility]  ECHOCARDIOGRAM COMPLETE Result Date: 05/21/2024    ECHOCARDIOGRAM REPORT   Patient Name:   Joseph Kim Date of Exam: 05/21/2024 Medical Rec #:  [id number]  Height:       69.0 in Accession #:    [id number] Weight:       233.3 lb Date of Birth:  1966/06/12  BSA:          2.206 m Patient Age:    58 years   BP:           127/89 mmHg Patient Gender: M          HR:           76 bpm. Exam Location:  Inpatient Procedure: 2D Echo, Color Doppler and Cardiac Doppler (Both Spectral and Color            Flow Doppler were utilized during procedure). Indications:    Chest Pain  History:        Patient has no  prior history of Echocardiogram examinations.                 Signs/Symptoms:Chest Pain.  Sonographer:    Jeralene Mom Referring Phys: [id number] XIKA ZHAO IMPRESSIONS  1. Left ventricular ejection fraction, by estimation, is 65 to 70%. The left ventricle has normal function. The left ventricle has no regional wall motion abnormalities. Left ventricular diastolic parameters are consistent with Grade I diastolic dysfunction (impaired relaxation).  2. Right ventricular systolic function is normal. The right ventricular size is normal.  3. The mitral valve is normal in structure. No evidence of mitral valve regurgitation. No evidence of mitral stenosis.  4. The aortic valve is normal in structure. Aortic valve regurgitation is not visualized. No aortic stenosis is present.  5. The inferior vena cava is normal in size with greater than 50% respiratory variability, suggesting right atrial pressure of 3 mmHg. FINDINGS  Left Ventricle: Left ventricular ejection fraction, by estimation, is 65 to 70%. The left ventricle has normal function. The left ventricle has no regional wall motion abnormalities. The left ventricular internal cavity size was normal in size. There is  no left ventricular hypertrophy. Left ventricular diastolic parameters are consistent with Grade I diastolic dysfunction (impaired relaxation). Right Ventricle: The right ventricular size is normal. No increase in right ventricular wall thickness. Right ventricular systolic function is normal. Left Atrium: Left atrial size was normal in size. Right Atrium: Right atrial size was normal in size. Pericardium: There is no evidence of pericardial effusion. Mitral Valve: The mitral valve is normal in structure. No evidence of mitral valve regurgitation. No evidence of mitral valve stenosis. Tricuspid Valve: The tricuspid valve is normal in structure. Tricuspid valve regurgitation is not demonstrated. No evidence of tricuspid stenosis. Aortic Valve: The aortic  valve is normal in structure. Aortic valve regurgitation is not visualized. No aortic stenosis is present. Aortic valve mean gradient measures 3.0 mmHg. Aortic valve peak gradient measures 4.6 mmHg. Aortic valve area, by VTI measures 2.81 cm. Pulmonic Valve: The pulmonic valve was normal in structure. Pulmonic valve regurgitation is not visualized. No evidence of pulmonic stenosis. Aorta: The aortic root is normal in size and structure. Venous: The inferior vena cava is normal  in size with greater than 50% respiratory variability, suggesting right atrial pressure of 3 mmHg. IAS/Shunts: No atrial level shunt detected by color flow Doppler.  LEFT VENTRICLE PLAX 2D LVIDd:         4.70 cm   Diastology LVIDs:         2.80 cm   LV e' medial:    6.42 cm/s LV PW:         0.90 cm   LV E/e' medial:  9.4 LV IVS:        0.90 cm   LV e' lateral:   8.96 cm/s LVOT diam:     2.00 cm   LV E/e' lateral: 6.7 LV SV:         61 LV SV Index:   28 LVOT Area:     3.14 cm  RIGHT VENTRICLE RV Basal diam:  3.50 cm RV Mid diam:    3.30 cm RV S prime:     18.40 cm/s TAPSE (M-mode): 2.6 cm LEFT ATRIUM             Index        RIGHT ATRIUM           Index LA diam:        4.20 cm 1.90 cm/m   RA Area:     14.80 cm LA Vol (A2C):   45.4 ml 20.58 ml/m  RA Volume:   31.50 ml  14.28 ml/m LA Vol (A4C):   36.3 ml 16.46 ml/m LA Biplane Vol: 41.2 ml 18.68 ml/m  AORTIC VALVE AV Area (Vmax):    2.37 cm AV Area (Vmean):   2.23 cm AV Area (VTI):     2.81 cm AV Vmax:           107.00 cm/s AV Vmean:          74.700 cm/s AV VTI:            0.217 m AV Peak Grad:      4.6 mmHg AV Mean Grad:      3.0 mmHg LVOT Vmax:         80.60 cm/s LVOT Vmean:        53.100 cm/s LVOT VTI:          0.194 m LVOT/AV VTI ratio: 0.89  AORTA Ao Root diam: 3.10 cm Ao Asc diam:  3.80 cm MITRAL VALVE MV Area (PHT): 3.30 cm    SHUNTS MV Decel Time: 230 msec    Systemic VTI:  0.19 m MV E velocity: 60.40 cm/s  Systemic Diam: 2.00 cm MV A velocity: 84.80 cm/s MV E/A ratio:  0.71  Dorothye Gathers MD Electronically signed by Dorothye Gathers MD Signature Date/Time: 05/21/2024/11:05:09 AM    Final    DG Chest 2 View Result Date: 05/20/2024 CLINICAL DATA:  Chest pain. EXAM: CHEST - 2 VIEW COMPARISON:  Chest radiograph dated 01/17/2014. FINDINGS: The heart size and mediastinal contours are within normal limits. Both lungs are clear. The visualized skeletal structures are unremarkable. IMPRESSION: No active cardiopulmonary disease. Electronically Signed   By: Angus Bark M.D.   On: 05/20/2024 18:29    Disposition Pt is being discharged home today in good condition.  Follow-up Plans & Appointments  Discharge Instructions     Amb Referral to Cardiac Rehabilitation   Complete by: As directed    To [place]   Diagnosis:  Coronary Stents NSTEMI PTCA     After initial evaluation and assessments completed: Virtual Based Care may  be provided alone or in conjunction with Phase 2 Cardiac Rehab based on patient barriers.: Yes   Intensive Cardiac Rehabilitation (ICR) [facility] location only OR Traditional Cardiac Rehabilitation (TCR) *If criteria for ICR are not met will enroll in TCR [facility] only): Yes   Call MD for:  difficulty breathing, headache or visual disturbances   Complete by: As directed    Call MD for:  persistant dizziness or light-headedness   Complete by: As directed    Call MD for:  redness, tenderness, or signs of infection (pain, swelling, redness, odor or green/yellow discharge around incision site)   Complete by: As directed    Diet - low sodium heart healthy   Complete by: As directed    Discharge instructions   Complete by: As directed    Radial Site Care Refer to this sheet in the next few weeks. These instructions provide you with information on caring for yourself after your procedure. Your caregiver may also give you more specific instructions. Your treatment has been planned according to current medical practices, but problems sometimes occur. Call your caregiver  if you have any problems or questions after your procedure. HOME CARE INSTRUCTIONS You may shower the day after the procedure. Remove the bandage (dressing) and gently wash the site with plain soap and water. Gently pat the site dry.  Do not apply powder or lotion to the site.  Do not submerge the affected site in water for 3 to 5 days.  Inspect the site at least twice daily.  Do not flex or bend the affected arm for 24 hours.  No lifting over 5 pounds (2.3 kg) for 5 days after your procedure.  Do not drive home if you are discharged the same day of the procedure. Have someone else drive you.  You may drive 24 hours after the procedure unless otherwise instructed by your caregiver.  What to expect: Any bruising will usually fade within 1 to 2 weeks.  Blood that collects in the tissue (hematoma) may be painful to the touch. It should usually decrease in size and tenderness within 1 to 2 weeks.  SEEK IMMEDIATE MEDICAL CARE IF: You have unusual pain at the radial site.  You have redness, warmth, swelling, or pain at the radial site.  You have drainage (other than a small amount of blood on the dressing).  You have chills.  You have a fever or persistent symptoms for more than 72 hours.  You have a fever and your symptoms suddenly get worse.  Your arm becomes pale, cool, tingly, or numb.  You have heavy bleeding from the site. Hold pressure on the site.   PLEASE DO NOT MISS ANY DOSES OF YOUR EFFIENT!!!!! Also keep a log of you blood pressures and bring back to your follow up appt. Please call the office with any questions.   Patients taking blood thinners should generally stay away from medicines like ibuprofen, Advil, Motrin, naproxen, and Aleve due to risk of stomach bleeding. You may take Tylenol  as directed or talk to your primary doctor about alternatives.   PLEASE ENSURE THAT YOU DO NOT RUN OUT OF YOUR EFFIENT. This medication is very important to remain on for at least one year. IF  you have issues obtaining this medication due to cost please CALL the office 3-5 business days prior to running out in order to prevent missing doses of this medication.   Increase activity slowly   Complete by: As directed  Discharge Medications Allergies as of 05/22/2024       Reactions   Amoxicillin Nausea Only        Medication List     STOP taking these medications    ibuprofen 200 MG tablet Commonly known as: ADVIL   ketorolac  10 MG tablet Commonly known as: TORADOL        TAKE these medications    aspirin  EC 81 MG tablet Take 1 tablet (81 mg total) by mouth daily. Swallow whole. Start taking on: May 23, 2024   Beet Root 500 MG Caps Take 2 capsules by mouth daily.   magnesium 30 MG tablet Take 30 mg by mouth daily.   metoprolol succinate 50 MG 24 hr tablet Commonly known as: TOPROL-XL Take 1 tablet (50 mg total) by mouth daily. Take with or immediately following a meal. Start taking on: May 23, 2024   nitroGLYCERIN  0.4 MG SL tablet Commonly known as: NITROSTAT  Place 1 tablet (0.4 mg total) under the tongue every 5 (five) minutes x 3 doses as needed for chest pain.   ondansetron  4 MG disintegrating tablet Commonly known as: Zofran  ODT 4mg  ODT q4 hours prn nausea/vomit   oxyCODONE -acetaminophen  5-325 MG tablet Commonly known as: Percocet Take 1-2 tablets by mouth every 4 (four) hours as needed for moderate pain or severe pain.   prasugrel 10 MG Tabs tablet Commonly known as: EFFIENT Take 1 tablet (10 mg total) by mouth daily. Start taking on: May 23, 2024   rosuvastatin 20 MG tablet Commonly known as: CRESTOR Take 1 tablet (20 mg total) by mouth daily. Start taking on: May 23, 2024   tamsulosin  0.4 MG Caps capsule Commonly known as: Flomax  Take 1 capsule (0.4 mg total) by mouth daily.         Outstanding Labs/Studies  BMET at follow up  FLP/LFTs in 4 weeks  Duration of Discharge Encounter: APP Time: 20 minutes    Signed, Johnie Nailer, NP 05/22/2024, 11:24 AM

## 2024-05-22 NOTE — Telephone Encounter (Signed)
   Transition of Care Follow-up Phone Call Request    Patient Name: Joseph Kim Date of Birth: [birth year] Date of Encounter: 05/22/2024  Primary Care Provider:  No primary care provider on file. Primary Cardiologist:  Kardie Tobb, DO  Joseph Kim has been scheduled for a transition of care follow up appointment with a [facility] provider:  [name] 6/16  Please reach out to Joseph Kim within 48 hours of discharge to confirm appointment and review transition of care protocol questionnaire. Anticipated discharge date: 05/22/2024  Johnie Nailer, NP  05/22/2024, 11:25 AM

## 2024-05-22 NOTE — Progress Notes (Signed)
 Discharge instructions reviewed with pt and his wife, both verbalize understanding and able to teach back instructions.  Copy of instructions given to pt. [facility] has filled pt's scripts and will be picked up on the way out for discharge.  Pt will be d/c'd via wheelchair with belongings and will be escorted by staff/hospital volunteer.   Honesti Seaberg,RN SWOT

## 2024-05-22 NOTE — Progress Notes (Signed)
  MATCH MEDICATION ASSISTANCE CARD Pharmacies please call [phone] for claim processing assistance.  Rx BIN: [id number] Rx Group: [id number] Rx PCN: [id number] Relationship Code: 1 Person Code: [id number]  Patient ID (MRN): [id number]    Patient Name: Joseph Kim   Patient DOB 25-Jun-1966   Discharge Date:05/22/2024  Expiration Date:05/28/2024 (must be filled within 7 days of discharge)   Dear [name] have been approved to have the prescriptions written by your discharging physician filled through our [facility] (Medication Assistance Through [facility]) program. This program allows for a one-time (no refills) 34-day supply of selected medications for a low copay amount.  The copay is $3.00 per prescription. For instance, if you have one prescription, you will pay $3.00; for two prescriptions, you pay $6.00; for three prescriptions, you pay $9.00; and so on. Only certain pharmacies are participating in this program with [facility]. You will need to select one of the pharmacies from the attached lists and take your prescriptions, this letter, and your photo ID to one of the participating pharmacies.  We are excited that you are able to use the [facility] program to get your medications. These prescriptions must be filled within 7 days of hospital discharge or they will no longer be valid for the [facility] program. Should you have any problems with your prescriptions please contact your case management team member at [phone] for [name]/ or [phone] for [name].  Thank you, [facility]

## 2024-05-22 NOTE — TOC Transition Note (Signed)
 Transition of Care [facility]) - Discharge Note   Patient Details  Name: Joseph Kim MRN: [id number] Date of Birth: Nov 30, 1966  [facility] CM/SW Contact:  Jeani Mill, RN Phone Number: 05/22/2024, 12:36 PM   Clinical Narrative:    Patient is stable discharge.  MATCH letter done. PCP apt made.  Family to transport home. \    Final next level of care: Home/Self Care Barriers to Discharge: Barriers Resolved   Patient Goals and CMS Choice Patient states their goals for this hospitalization and ongoing recovery are:: Return home          Discharge Placement               Home        Discharge Plan and Services Additional resources added to the After Visit Summary for   In-house Referral: PCP / Health Connect Discharge Planning Services: MATCH Program, Follow-up appt scheduled                                 Social Drivers of Health (SDOH) Interventions SDOH Screenings   Food Insecurity: No Food Insecurity (05/21/2024)  Housing: Low Risk  (05/21/2024)  Transportation Needs: No Transportation Needs (05/21/2024)  Utilities: Not At Risk (05/21/2024)  Tobacco Use: Medium Risk (05/20/2024)     Readmission Risk Interventions     No data to display

## 2024-05-23 LAB — LIPOPROTEIN A (LPA): Lipoprotein (a): 129.2 nmol/L — ABNORMAL HIGH (ref <75.0)

## 2024-05-25 NOTE — Telephone Encounter (Signed)
 Transition Care Management Follow-up Telephone Call Date of discharge and from where: 05/22/24 Joseph Kim How have you been since you were released from the hospital? Feeling well Any questions or concerns? No  Items Reviewed: Did the pt receive and understand the discharge instructions provided? Yes  Medications obtained and verified? Yes  Other? No  Any new allergies since your discharge? No  Dietary orders reviewed? Yes Do you have support at home? Yes   Home Care and Equipment/Supplies: Were home health services ordered? no If so, what is the name of the agency?   Has the agency set up a time to come to the patient's home? no Were any new equipment or medical supplies ordered?  No What is the name of the medical supply agency?  Were you able to get the supplies/equipment? not applicable Do you have any questions related to the use of the equipment or supplies? No  Functional Questionnaire: (I = Independent and D = Dependent) ADLs: I  Bathing/Dressing- I   Meal Prep- I  Eating- I  Maintaining continence- I  Transferring/Ambulation- I  Managing Meds- I  Follow up appointments reviewed:  PCP Hospital f/u appt confirmed? No  Patient states he has not had a PCP in 40 years, advised patient to get a PCP. Specialist Hospital f/u appt confirmed? Yes  Scheduled to see Joseph Kim  06/01/24 Are transportation arrangements needed? Yes  If their condition worsens, is the pt aware to call PCP or go to the Emergency Dept.? Yes Was the patient provided with contact information for the PCP's office or ED? Yes Was to pt encouraged to call back with questions or concerns? Yes

## 2024-05-29 ENCOUNTER — Ambulatory Visit (HOSPITAL_COMMUNITY)
Admission: RE | Admit: 2024-05-29 | Discharge: 2024-05-29 | Disposition: A | Discharge status: Home / Self Care | Payer: Self-pay | Source: Ambulatory Visit | Attending: Cardiovascular Disease | Admitting: Cardiovascular Disease

## 2024-05-29 ENCOUNTER — Other Ambulatory Visit (HOSPITAL_COMMUNITY): Payer: Self-pay

## 2024-05-29 ENCOUNTER — Telehealth: Payer: Self-pay | Admitting: Cardiology

## 2024-05-29 ENCOUNTER — Telehealth (HOSPITAL_COMMUNITY): Payer: Self-pay

## 2024-05-29 ENCOUNTER — Ambulatory Visit: Payer: Self-pay | Admitting: Cardiology

## 2024-05-29 DIAGNOSIS — I214 Non-ST elevation (NSTEMI) myocardial infarction: Secondary | ICD-10-CM | POA: Insufficient documentation

## 2024-05-29 DIAGNOSIS — R0602 Shortness of breath: Secondary | ICD-10-CM | POA: Insufficient documentation

## 2024-05-29 DIAGNOSIS — I10 Essential (primary) hypertension: Secondary | ICD-10-CM

## 2024-05-29 NOTE — Telephone Encounter (Signed)
 New Message:    Patient had a heart attack on 05-20-24 and had 2 stents put in. Last night he had an episode of shortness of breath. Wife called EMS. She was told to call this morning and report that episode.    Pt c/o Shortness Of Breath: STAT if SOB developed within the last 24 hours or pt is noticeably SOB on the phone  1. Are you currently SOB (can you hear that pt is SOB on the phone)? No at this time- he only has it laying down  2. How long have you been experiencing SOB? Last night  3. Are you SOB when sitting or when up moving around? Only laying down  4.  Are you currently experiencing any other symptoms? No- he has on Monday with [name]- please call to evaluate

## 2024-05-29 NOTE — Telephone Encounter (Signed)
 Per phase I cardiac rehab, fax pt referral to [facility]

## 2024-05-29 NOTE — Telephone Encounter (Signed)
 Spoke with pt and wife who reports pt developed SOB while lying down last night.  EMS was called.  Vital signs were 146/80, HR - 80 O2 sat 98%.  EMS advised pt to contact cardiology today.  Pt ended up sleeping in his recliner. Pt continues to feel SOB when lying down this morning. Pt denies CP or dizziness.  Echo shows grade 1,diastolic dysfunction.  BP today 139/84 HR - 71.  He reports he is taking medications as prescribed.  Spoke with Dr Stann Earnest, DOD who recommends BMET, BNP and chest xray today.  Pt is scheduled to see [facility] on 06/01/2024.  Pt 's wife advised of recommendation and verbalizes understanding and agrees with current plan.

## 2024-05-30 LAB — BASIC METABOLIC PANEL WITH GFR
BUN/Creatinine Ratio: 14 (ref 9–20)
BUN: 17 mg/dL (ref 6–24)
CO2: 21 mmol/L (ref 20–29)
Calcium: 9.2 mg/dL (ref 8.7–10.2)
Chloride: 101 mmol/L (ref 96–106)
Creatinine, Ser: 1.2 mg/dL (ref 0.76–1.27)
Glucose: 95 mg/dL (ref 70–99)
Potassium: 4.8 mmol/L (ref 3.5–5.2)
Sodium: 137 mmol/L (ref 134–144)
eGFR: 71 mL/min/{1.73_m2} (ref >59)

## 2024-05-30 LAB — PRO B NATRIURETIC PEPTIDE: NT-Pro BNP: 88 pg/mL (ref 0–210)

## 2024-05-31 NOTE — Progress Notes (Unsigned)
 Cardiology Office Note    Date:  06/02/2024  ID:  Janann Meadow, DOB [birth year], MRN [id number] PCP:  Pcp, No  Cardiologist:  Jerryl Morin, DO  Electrophysiologist:  None   Chief Complaint: Follow up for CAD   History of Present Illness: .    Emeric Novinger is a 58 y.o. male with visit-pertinent history of CAD s/p overlapping DES x 2 to the ostial/mid LAD in setting of NSTEMI on 05/21/2024, hypertension and hyperlipidemia.  Patient presented on 05/21/2024 reporting several weeks onset of exertional chest pain, sometimes worsened by laying flat with associated shortness of breath.  High sensitivty troponin 79>>114.  EKG with nonspecific T wave flattening lateral leads.  Underwent cardiac catheterization on 05/21/2024 with severe single-vessel CAD throughout the mid LAD with successful IVUS guided PCI to the ostial/mid LAD with overlapping DES x 2.  Recommendations were for DAPT with aspirin /Effient  for at least 1 year.  Echocardiogram with LVEF 65 to 70%, grade 1 DD, normal RV, no significant valvular disease.  On 05/29/2024 patient notified the office that he had an episode of shortness of breath, his wife called EMS.  Patient reported that at night he developed shortness of breath while laying down, EMS was called.  Blood pressure was 146/80, heart rate 80 with oxygen saturation 98%.  Blood pressure that day 139/84.  Reviewed with Dr. Stann Earnest, DOD at [facility] who recommended BMET, BNP and chest X-ray.  There was no evidence of fluid overload in lab work.  Today he presents for follow-up with his wife.  He reports that he is doing very well overall.  He denies any further chest pain or shortness of breath. He notes following his episode of orthopnea he denies any further recurrence, workup overall reassuring, chest x-ray has not yet resulted. Patient reports feeling very well, feels he is back to his normal. His right radial cath site is healing well.   Labwork independently reviewed: 05/29/2024:  Sodium 137, potassium 4.8, creatinine 1.20, NT proBNP 88. ROS: .   Today he denies chest pain, shortness of breath, lower extremity edema, fatigue, palpitations, melena, hematuria, hemoptysis, diaphoresis, weakness, presyncope, syncope, orthopnea, and PND.  All other systems are reviewed and otherwise negative. Studies Reviewed: Aaron Aas   EKG:  EKG is ordered today, personally reviewed, demonstrating  EKG Interpretation Date/Time:  Monday June 01 2024 14:41:51 EDT Ventricular Rate:  65 PR Interval:  126 QRS Duration:  98 QT Interval:  392 QTC Calculation: 407 R Axis:   -31  Text Interpretation: Normal sinus rhythm Left axis deviation Minimal voltage criteria for LVH, may be normal variant ( R in aVL ) When compared with ECG of 22-May-2024 06:43, No significant change was found Confirmed by Alben Jepsen [phone]) on 06/01/2024 5:10:32 PM   CV Studies: Cardiac studies reviewed are outlined and summarized above. Otherwise please see EMR for full report. Cardiac Studies & Procedures   ______________________________________________________________________________________________ CARDIAC CATHETERIZATION  CARDIAC CATHETERIZATION 05/21/2024  Conclusion Conclusions: Severe, single-vessel coronary artery disease with multifocal ostial through mid LAD disease of 70-95%.  20% ostial LMCA and 10-20% proximal RCA stenoses are also present. Upper normal left ventricular filling pressure (LVEDP 15 mmHg). Successful IVUS-guided PCI to ostial through mid LAD with overlapping Synergy XD 3.5 x 16 mm (proximal) and 3.0 x 48 mm (distal) drug-eluting stents with 0% residual stenosis and TIMI-3 flow.  Recommendations: Dual antiplatelet therapy with aspirin  and prasugrel  for at least 12 months. Aggressive secondary prevention of coronary artery disease.  Sammy Crisp, MD  [facility]  Findings Coronary Findings Diagnostic  Dominance: Right  Left Main Vessel is large. Ost LM lesion is 20% stenosed.  Left  Anterior Descending Vessel is large. Ost LAD to Prox LAD lesion is 70% stenosed. Prox LAD to Mid LAD lesion is 95% stenosed. The lesion is moderately calcified. Mid LAD-1 lesion is 25% stenosed. Mid LAD-2 lesion is 80% stenosed.  First Diagonal Branch Vessel is small in size. There is severe disease in the vessel.  Second Diagonal Branch Vessel is small in size.  Third Diagonal Branch Vessel is moderate in size.  Left Circumflex Vessel is angiographically normal.  First Obtuse Marginal Branch Vessel is small in size.  Second Obtuse Marginal Branch Vessel is large in size.  Third Obtuse Marginal Branch Vessel is small in size.  Right Coronary Artery Vessel is large. Prox RCA lesion is 15% stenosed.  Right Posterior Descending Artery Vessel is moderate in size.  Right Posterior Atrioventricular Artery Vessel is moderate in size.  First Right Posterolateral Branch Vessel is small in size.  Second Right Posterolateral Branch Vessel is small in size.  Third Right Posterolateral Branch Vessel is moderate in size.  Intervention  Ost LAD to Prox LAD lesion Stent CATH VISTA GUIDE 6FR XBLD 3.5 guide catheter was inserted. Lesion crossed with guidewire using a WIRE RUNTHROUGH .[id number]. Pre-stent angioplasty was performed using a BALLOON EMERGE MR 2.5X12. Maximum pressure:  6 atm.  A second angioplasty balloon was used, using a BALLOON SCOREFLEX 3.0X15. Maximum pressure:  12 atm. A drug-eluting stent was successfully placed using a STENT SYNERGY XD 3.50X16. Maximum pressure: 14 atm. Stent strut is well apposed. Stent overlaps previously placed stent. Post-stent angioplasty was performed using a BALLOON [place] EMERGE MR 3.75X8. Maximum pressure:  16 atm. Post-Intervention Lesion Assessment The intervention was successful. Pre-interventional TIMI flow is 3. Post-intervention TIMI flow is 3. No complications occurred at this lesion. Ultrasound (IVUS) was performed on the lesion  post PCI. Stent well expanded. There is a 0% residual stenosis post intervention.  Prox LAD to Mid LAD lesion Stent (Also treats lesions: Mid LAD-1, and Mid LAD-2) CATH VISTA GUIDE 6FR XBLD 3.5 guide catheter was inserted. Lesion crossed with guidewire using a WIRE RUNTHROUGH .[id number]. Pre-stent angioplasty was performed using a BALLOON EMERGE MR 2.5X12. Maximum pressure:  12 atm.  A second angioplasty balloon was used, using a BALLOON SCOREFLEX 3.0X15. Maximum pressure:  12 atm. A drug-eluting stent was successfully placed using a STENT SYNERGY XD 3.0X48. Maximum pressure: 16 atm. Stent strut is well apposed. Post-stent angioplasty was performed using a BALLOON [place] EMERGE MR 3.25X15. Maximum pressure:  18 atm. Post-Intervention Lesion Assessment The intervention was successful. Pre-interventional TIMI flow is 3. Post-intervention TIMI flow is 3. No complications occurred at this lesion. Ultrasound (IVUS) was performed on the lesion post PCI. Stent well expanded. There is a 0% residual stenosis post intervention.  Mid LAD-1 lesion Stent (Also treats lesions: Prox LAD to Mid LAD, and Mid LAD-2) See details in Prox LAD to Mid LAD lesion. Post-Intervention Lesion Assessment The intervention was successful. Pre-interventional TIMI flow is 3. Post-intervention TIMI flow is 3. No complications occurred at this lesion. Ultrasound (IVUS) was performed on the lesion post PCI. Stent well expanded. There is a 0% residual stenosis post intervention.  Mid LAD-2 lesion Stent (Also treats lesions: Prox LAD to Mid LAD, and Mid LAD-1) See details in Prox LAD to Mid LAD lesion. Post-Intervention Lesion Assessment The intervention was successful. Pre-interventional TIMI flow is 3. Post-intervention TIMI  flow is 3. No complications occurred at this lesion. Ultrasound (IVUS) was performed on the lesion post PCI. Stent well expanded. There is a 0% residual stenosis post intervention.      ECHOCARDIOGRAM  ECHOCARDIOGRAM COMPLETE 05/21/2024  Narrative ECHOCARDIOGRAM REPORT    Patient Name:   MICHEL HENDON Date of Exam: 05/21/2024 Medical Rec #:  [id number]  Height:       69.0 in Accession #:    [id number] Weight:       233.3 lb Date of Birth:  [birth year]  BSA:          2.206 m Patient Age:    58 years   BP:           127/89 mmHg Patient Gender: M          HR:           76 bpm. Exam Location:  Inpatient  Procedure: 2D Echo, Color Doppler and Cardiac Doppler (Both Spectral and Color Flow Doppler were utilized during procedure).  Indications:    Chest Pain  History:        Patient has no prior history of Echocardiogram examinations. Signs/Symptoms:Chest Pain.  Sonographer:    Jeralene Mom Referring Phys: [id number] XIKA ZHAO  IMPRESSIONS   1. Left ventricular ejection fraction, by estimation, is 65 to 70%. The left ventricle has normal function. The left ventricle has no regional wall motion abnormalities. Left ventricular diastolic parameters are consistent with Grade I diastolic dysfunction (impaired relaxation). 2. Right ventricular systolic function is normal. The right ventricular size is normal. 3. The mitral valve is normal in structure. No evidence of mitral valve regurgitation. No evidence of mitral stenosis. 4. The aortic valve is normal in structure. Aortic valve regurgitation is not visualized. No aortic stenosis is present. 5. The inferior vena cava is normal in size with greater than 50% respiratory variability, suggesting right atrial pressure of 3 mmHg.  FINDINGS Left Ventricle: Left ventricular ejection fraction, by estimation, is 65 to 70%. The left ventricle has normal function. The left ventricle has no regional wall motion abnormalities. The left ventricular internal cavity size was normal in size. There is no left ventricular hypertrophy. Left ventricular diastolic parameters are consistent with Grade I diastolic dysfunction (impaired  relaxation).  Right Ventricle: The right ventricular size is normal. No increase in right ventricular wall thickness. Right ventricular systolic function is normal.  Left Atrium: Left atrial size was normal in size.  Right Atrium: Right atrial size was normal in size.  Pericardium: There is no evidence of pericardial effusion.  Mitral Valve: The mitral valve is normal in structure. No evidence of mitral valve regurgitation. No evidence of mitral valve stenosis.  Tricuspid Valve: The tricuspid valve is normal in structure. Tricuspid valve regurgitation is not demonstrated. No evidence of tricuspid stenosis.  Aortic Valve: The aortic valve is normal in structure. Aortic valve regurgitation is not visualized. No aortic stenosis is present. Aortic valve mean gradient measures 3.0 mmHg. Aortic valve peak gradient measures 4.6 mmHg. Aortic valve area, by VTI measures 2.81 cm.  Pulmonic Valve: The pulmonic valve was normal in structure. Pulmonic valve regurgitation is not visualized. No evidence of pulmonic stenosis.  Aorta: The aortic root is normal in size and structure.  Venous: The inferior vena cava is normal in size with greater than 50% respiratory variability, suggesting right atrial pressure of 3 mmHg.  IAS/Shunts: No atrial level shunt detected by color flow Doppler.   LEFT VENTRICLE PLAX 2D LVIDd:  4.70 cm   Diastology LVIDs:         2.80 cm   LV e' medial:    6.42 cm/s LV PW:         0.90 cm   LV E/e' medial:  9.4 LV IVS:        0.90 cm   LV e' lateral:   8.96 cm/s LVOT diam:     2.00 cm   LV E/e' lateral: 6.7 LV SV:         61 LV SV Index:   28 LVOT Area:     3.14 cm   RIGHT VENTRICLE RV Basal diam:  3.50 cm RV Mid diam:    3.30 cm RV S prime:     18.40 cm/s TAPSE (M-mode): 2.6 cm  LEFT ATRIUM             Index        RIGHT ATRIUM           Index LA diam:        4.20 cm 1.90 cm/m   RA Area:     14.80 cm LA Vol (A2C):   45.4 ml 20.58 ml/m  RA Volume:    31.50 ml  14.28 ml/m LA Vol (A4C):   36.3 ml 16.46 ml/m LA Biplane Vol: 41.2 ml 18.68 ml/m AORTIC VALVE AV Area (Vmax):    2.37 cm AV Area (Vmean):   2.23 cm AV Area (VTI):     2.81 cm AV Vmax:           107.00 cm/s AV Vmean:          74.700 cm/s AV VTI:            0.217 m AV Peak Grad:      4.6 mmHg AV Mean Grad:      3.0 mmHg LVOT Vmax:         80.60 cm/s LVOT Vmean:        53.100 cm/s LVOT VTI:          0.194 m LVOT/AV VTI ratio: 0.89  AORTA Ao Root diam: 3.10 cm Ao Asc diam:  3.80 cm  MITRAL VALVE MV Area (PHT): 3.30 cm    SHUNTS MV Decel Time: 230 msec    Systemic VTI:  0.19 m MV E velocity: 60.40 cm/s  Systemic Diam: 2.00 cm MV A velocity: 84.80 cm/s MV E/A ratio:  0.71  Dorothye Gathers MD Electronically signed by Dorothye Gathers MD Signature Date/Time: 05/21/2024/11:05:09 AM    Final          ______________________________________________________________________________________________       Current Reported Medications:.    Current Meds  Medication Sig   aspirin  EC 81 MG tablet Take 1 tablet (81 mg total) by mouth daily. Swallow whole.   magnesium 30 MG tablet Take 30 mg by mouth daily.   Misc Natural Products (BEET ROOT) 500 MG CAPS Take 2 capsules by mouth daily.   [DISCONTINUED] metoprolol  succinate (TOPROL -XL) 50 MG 24 hr tablet Take 1 tablet (50 mg total) by mouth daily. Take with or immediately following a meal.   [DISCONTINUED] nitroGLYCERIN  (NITROSTAT ) 0.4 MG SL tablet Place 1 tablet (0.4 mg total) under the tongue every 5 (five) minutes x 3 doses as needed for chest pain.   [DISCONTINUED] prasugrel  (EFFIENT ) 10 MG TABS tablet Take 1 tablet (10 mg total) by mouth daily.   [DISCONTINUED] rosuvastatin  (CRESTOR ) 20 MG tablet Take 1 tablet (20 mg total) by mouth daily.  Physical Exam:    VS:  BP 122/80   Pulse 65   Ht 5' 9 (1.753 m)   Wt 222 lb (100.7 kg)   SpO2 96%   BMI 32.78 kg/m    Wt Readings from Last 3 Encounters:  06/01/24 222 lb  (100.7 kg)  05/22/24 221 lb 5.5 oz (100.4 kg)  03/12/18 213 lb (96.6 kg)    GEN: Well nourished, well developed in no acute distress NECK: No JVD; No carotid bruits CARDIAC: RRR, no murmurs, rubs, gallops, right radial cath site clean and intact without evidence of hematoma RESPIRATORY:  Clear to auscultation without rales, wheezing or rhonchi  ABDOMEN: Soft, non-tender, non-distended EXTREMITIES:  No edema; No acute deformity     Asessement and Plan:.    NSTEMI: Cardiac catheterization on 05/21/2024 with severe single-vessel CAD throughout the mid LAD with successful IVUS guided PCI to the ostial/mid LAD with overlapping DES x 2.  Recommendations were for DAPT with aspirin /Effient  for at least 1 year.  Echocardiogram with LVEF 65 to 70%, grade 1 DD, normal RV, no significant valvular disease.  Patient with 1 episode of orthopnea last week, Bmet and BNP were normal.  Patient denies any further orthopnea, denies chest pain or shortness of breath.  He reports that he feels very well overall.  His right radial site site is clean and intact without evidence of hematoma.  Patient can start cardiac rehab.  Reviewed ED precautions.  Continue aspirin  81 mg daily, metoprolol  succinate 50 mg daily, Effient  10 mg daily and Crestor  20 mg daily.  Hypertension: Blood pressure today 122/80.  Encouraged patient to monitor his blood pressure at home and notify the office if consistently elevated above 130/80.  If elevated would recommend starting on ARB.  Otherwise continue metoprolol  succinate 50 mg daily.  HLD: Last lipid profile on 05/21/24 indicated total cholesterol 208, triglycerides 311, HDL 33 and LDL 113. Started rosuvastatin  20 mg. Check fasting lipid profile and LFTs in 6-8 weeks.     Disposition: F/u with Dr. Emmette Harms or Aiman Sonn, NP in 2-3 months or sooner if needed.   Signed, Alam Guterrez D Elener Custodio, NP

## 2024-06-01 ENCOUNTER — Encounter: Payer: Self-pay | Admitting: Cardiology

## 2024-06-01 ENCOUNTER — Other Ambulatory Visit (HOSPITAL_COMMUNITY): Payer: Self-pay

## 2024-06-01 ENCOUNTER — Telehealth: Payer: Self-pay | Admitting: Cardiology

## 2024-06-01 ENCOUNTER — Ambulatory Visit: Payer: Self-pay | Attending: Cardiology | Admitting: Cardiology

## 2024-06-01 VITALS — BP 122/80 | HR 65 | Ht 69.0 in | Wt 222.0 lb

## 2024-06-01 DIAGNOSIS — I10 Essential (primary) hypertension: Secondary | ICD-10-CM

## 2024-06-01 DIAGNOSIS — I214 Non-ST elevation (NSTEMI) myocardial infarction: Secondary | ICD-10-CM

## 2024-06-01 DIAGNOSIS — E782 Mixed hyperlipidemia: Secondary | ICD-10-CM

## 2024-06-01 DIAGNOSIS — Z0279 Encounter for issue of other medical certificate: Secondary | ICD-10-CM

## 2024-06-01 DIAGNOSIS — I2510 Atherosclerotic heart disease of native coronary artery without angina pectoris: Secondary | ICD-10-CM

## 2024-06-01 MED ORDER — ROSUVASTATIN CALCIUM 20 MG PO TABS: 20.0000 mg | ORAL_TABLET | Freq: Every day | ORAL | 3 refills | Status: DC

## 2024-06-01 MED ORDER — NITROGLYCERIN 0.4 MG SL SUBL
0.4000 mg | SUBLINGUAL_TABLET | SUBLINGUAL | 2 refills | Status: AC | PRN
  Filled 2024-06-01: qty 25, 1d supply, fill #0
  Filled 2024-06-18: qty 25, 8d supply, fill #0

## 2024-06-01 MED ORDER — PRASUGREL HCL 10 MG PO TABS
10.0000 mg | ORAL_TABLET | Freq: Every day | ORAL | 3 refills | Status: AC
  Filled 2024-06-01 – 2024-06-18 (×2): qty 90, 90d supply, fill #0
  Filled 2024-09-16: qty 90, 90d supply, fill #1
  Filled 2024-12-13: qty 90, 90d supply, fill #2

## 2024-06-01 MED ORDER — NITROGLYCERIN 0.4 MG SL SUBL: 0.4000 mg | SUBLINGUAL_TABLET | SUBLINGUAL | 2 refills | Status: DC | PRN

## 2024-06-01 MED ORDER — METOPROLOL SUCCINATE ER 50 MG PO TB24
50.0000 mg | ORAL_TABLET | Freq: Every day | ORAL | 3 refills | Status: AC
  Filled 2024-06-01 – 2024-06-18 (×2): qty 90, 90d supply, fill #0
  Filled 2024-09-16: qty 90, 90d supply, fill #1
  Filled 2024-12-13: qty 90, 90d supply, fill #2
  Filled 2024-12-14: qty 35, 35d supply, fill #2

## 2024-06-01 MED ORDER — ROSUVASTATIN CALCIUM 20 MG PO TABS
20.0000 mg | ORAL_TABLET | Freq: Every day | ORAL | 3 refills | Status: DC
Start: 2024-06-01 — End: 2024-08-07
  Filled 2024-06-01 – 2024-06-18 (×2): qty 90, 90d supply, fill #0

## 2024-06-01 MED ORDER — METOPROLOL SUCCINATE ER 50 MG PO TB24: 50.0000 mg | ORAL_TABLET | Freq: Every day | ORAL | 3 refills | Status: DC

## 2024-06-01 MED ORDER — PRASUGREL HCL 10 MG PO TABS: 10.0000 mg | ORAL_TABLET | Freq: Every day | ORAL | 3 refills | Status: DC

## 2024-06-01 NOTE — Patient Instructions (Signed)
 Medication Instructions:  No changes *If you need a refill on your cardiac medications before your next appointment, please call your pharmacy*  Lab Work: 6 weeks we are going to check a fasting lipid panel and Lft's If you have labs (blood work) drawn today and your tests are completely normal, you will receive your results only by: MyChart Message (if you have MyChart) OR A paper copy in the mail If you have any lab test that is abnormal or we need to change your treatment, we will call you to review the results.  Testing/Procedures: No testing  Follow-Up: At [facility], you and your health needs are our priority.  As part of our continuing mission to provide you with exceptional heart care, our providers are all part of one team.  This team includes your primary Cardiologist (physician) and Advanced Practice Providers or APPs (Physician Assistants and Nurse Practitioners) who all work together to provide you with the care you need, when you need it.  Your next appointment:   2-3 month(s)  Provider:   Kardie Tobb, DO or [name], NP  We recommend signing up for the patient portal called MyChart.  Sign up information is provided on this After Visit Summary.  MyChart is used to connect with patients for Virtual Visits (Telemedicine).  Patients are able to view lab/test results, encounter notes, upcoming appointments, etc.  Non-urgent messages can be sent to your provider as well.   To learn more about what you can do with MyChart, go to [email or web address].

## 2024-06-01 NOTE — Progress Notes (Unsigned)
 Heart and Vascular Care Navigation  06/01/2024  Joseph Kim 05/20/1966 [id number]  Reason for Referral: self pay Patient is participating in a Managed Medicaid Plan: No, self pay only  Engaged with patient face to face for initial visit for Heart and Vascular Care Coordination.                                                                                                   Assessment:                  LCSW met with Joseph Kim and Joseph Kim wife today at [facility]. Introduced self, role, reason for visit. 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 They had reviewed all their options for insurance and had decided that this upcoming year Joseph Kim would enroll in his wife's insurance but unfortunately this recent hospitalization occurred before that time. Reviewed notes from patient account from financial counselor and note he is not eligible at this time for Medicaid. Explained how to complete [employer] application and started it with Joseph Kim demographic information. Discussed what documents needed to complete and how to submit- able to assist with process.   Will f/u if I do not hear from Joseph Kim/Joseph Kim wife to ensure no additional questions arise.                    HRT/VAS Care Coordination     Patients Home Cardiology Office --  [facility]   Outpatient Care Team Social Worker   Social Worker Name: Nathen Balder, [place], [phone]   Living arrangements for the past 2 months Single Family Home   Lives with: Spouse   Patient Current Insurance Coverage Self-Pay   Patient Has Concern With Paying Medical Bills Yes   Patient Concerns With Medical Bills self pay, not Medicaid eligible   Medical Bill Referrals: [facility] Financial Assisance program   Does Patient Have Prescription Coverage? No       Social History:                                                                             SDOH Screenings   Food  Insecurity: No Food Insecurity (06/01/2024)  Housing: Low Risk  (06/01/2024)  Transportation Needs: No Transportation Needs (06/01/2024)  Utilities: Not At Risk (06/01/2024)  Financial Resource Strain: Medium Risk (06/01/2024)  Tobacco Use: Medium Risk (06/01/2024)  Health Literacy: Adequate Health Literacy (06/01/2024)    SDOH Interventions: Financial Resources:  Surveyor, quantity Strain Interventions: Programmer, applications Provided, Child psychotherapist for [employer]  Food Insecurity:  Food Insecurity Interventions: Intervention Not Indicated  Housing Insecurity:  Housing Interventions: Intervention Not Indicated  Transportation:   Transportation Interventions: Intervention Not Indicated  Other Care Navigation Interventions:     Provided Pharmacy assistance resources  At this time Joseph Kim does not qualify for NCMedAssist- using [facility] and GoodRx would be best course of action for medication cost concerns   Follow-up plan:   LCSW provided Joseph Kim and Joseph Kim wife my contact number and application for [employer] Assistance program. Joseph Kim and Joseph Kim wife encouraged to call me with any additional questions that may arise while completing application and if they would like assistance with submitting application when completed. No additional questions at this time.

## 2024-06-01 NOTE — Telephone Encounter (Signed)
 Received [employer] critical illness form from patient today.  Form given to [place] Job for Dr. [place] to complete. Patient paid $29 fee and signed the release of information.

## 2024-06-02 ENCOUNTER — Ambulatory Visit: Payer: Self-pay | Admitting: Cardiovascular Disease

## 2024-06-02 ENCOUNTER — Encounter: Payer: Self-pay | Admitting: Cardiology

## 2024-06-04 NOTE — Telephone Encounter (Signed)
 Completed [employer] form faxed to insurance and scanned to chart. Billing and patient notified.

## 2024-06-10 ENCOUNTER — Telehealth: Payer: Self-pay | Admitting: Licensed Clinical Social Worker

## 2024-06-10 NOTE — Telephone Encounter (Signed)
 H&V Care Navigation CSW Progress Note  Clinical Social Worker contacted caregiver by phone to f/u on assistance paperwork. Was able to reach pt wife Montie (DPR on file) at [phone]. She shares they have been working on assistance paperwork. Had questions about tax return and discussed letter of nonfiling or letter of extension if granted. She will f/u if any additional questions arise. Hopes to have it done by next week.  Patient is participating in a Managed Medicaid Plan:  No, self pay only  SDOH Screenings   Food Insecurity: No Food Insecurity (06/01/2024)  Housing: Low Risk  (06/01/2024)  Transportation Needs: No Transportation Needs (06/01/2024)  Utilities: Not At Risk (06/01/2024)  Financial Resource Strain: Medium Risk (06/01/2024)  Tobacco Use: Medium Risk (06/02/2024)  Health Literacy: Adequate Health Literacy (06/01/2024)    Marit Lark, MSW, LCSW Clinical Social Worker II [facility] Heart/Vascular Care Navigation  [phone]- work cell phone (preferred)

## 2024-06-18 ENCOUNTER — Other Ambulatory Visit (HOSPITAL_COMMUNITY): Payer: Self-pay

## 2024-06-18 ENCOUNTER — Telehealth: Payer: Self-pay | Admitting: Licensed Clinical Social Worker

## 2024-06-18 NOTE — Telephone Encounter (Signed)
 H&V Care Navigation CSW Progress Note  Clinical Social Worker received a call from pt with two additional questions. Pt had sent refills in to [facility] as he was told they would be three dollars to fill, and pt noted that they were about $15 or so for some of them. LCSW shared that pt refill is for 90 day supply, most of his medications are on $5-$15 list which would explain that cost. Pt states understanding, discussed if becomes unmanageable can cost compare with [facility].   Pt also had questions about when to start making payments towards his bill, discussed calling billing to see when to start payments/how much they would be on payment plan. Pt encouraged to also let them know that they are working on [employer] to see if that effects when payments should start. No additional questions, will f/u to assess any updates over the next few weeks.  Patient is participating in a Managed Medicaid Plan:  No, self pay only  SDOH Screenings   Food Insecurity: Low Risk  (06/15/2024)   Received from [facility]  Housing: Low Risk  (06/15/2024)   Received from [facility]  Transportation Needs: No Transportation Needs (06/15/2024)   Received from [facility]  Utilities: Low Risk  (06/15/2024)   Received from [facility]  Financial Resource Strain: Medium Risk (06/01/2024)  Tobacco Use: Medium Risk (06/02/2024)  Health Literacy: Adequate Health Literacy (06/01/2024)    Marit Lark, MSW, LCSW Clinical Social Worker II [facility] Heart/Vascular Care Navigation  [phone]- work cell phone (preferred)

## 2024-06-18 NOTE — Telephone Encounter (Signed)
 H&V Care Navigation CSW Progress Note  Clinical Social Worker contacted caregiver by phone to f/u on assistance applications. Was able to reach pt wife today Wyman, [place] on file) at [phone]. She and pt are going to bank for final documents today, plan on sending in applications next week. Let her know I will be back in clinic on Tuesday and happy to answer any additional questions or concerns at that time.  Patient is participating in a Managed Medicaid Plan:  No, self pay only  SDOH Screenings   Food Insecurity: Low Risk  (06/15/2024)   Received from [facility]  Housing: Low Risk  (06/15/2024)   Received from [facility]  Transportation Needs: No Transportation Needs (06/15/2024)   Received from [facility]  Utilities: Low Risk  (06/15/2024)   Received from [facility]  Financial Resource Strain: Medium Risk (06/01/2024)  Tobacco Use: Medium Risk (06/02/2024)  Health Literacy: Adequate Health Literacy (06/01/2024)    Marit Lark, MSW, LCSW Clinical Social Worker II [facility] Heart/Vascular Care Navigation  [phone]- work cell phone (preferred)

## 2024-07-06 ENCOUNTER — Telehealth: Payer: Self-pay | Admitting: Licensed Clinical Social Worker

## 2024-07-06 NOTE — Telephone Encounter (Signed)
 H&V Care Navigation CSW Progress Note  Clinical Social Worker contacted patient by phone to f/u on patient assistance forms. Pt answered at [phone]. He asks me to call his wife, I called Dorthea at [phone] and was unable to reach her. Left a voicemail. Will re-attempt again as able.  Patient is participating in a Managed Medicaid Plan:  No, self pay only  SDOH Screenings   Food Insecurity: Low Risk  (06/15/2024)   Received from [facility]  Housing: Low Risk  (06/15/2024)   Received from [facility]  Transportation Needs: No Transportation Needs (06/15/2024)   Received from [facility]  Utilities: Low Risk  (06/15/2024)   Received from [facility]  Financial Resource Strain: Medium Risk (06/01/2024)  Tobacco Use: Low Risk  (06/25/2024)   Received from [facility]  Recent Concern: Tobacco Use - Medium Risk (06/02/2024)  Health Literacy: Adequate Health Literacy (06/01/2024)    Marit Lark, MSW, LCSW Clinical Social Worker II [facility] Heart/Vascular Care Navigation  [phone]- work cell phone (preferred)

## 2024-07-08 ENCOUNTER — Telehealth: Payer: Self-pay | Admitting: Licensed Clinical Social Worker

## 2024-07-08 ENCOUNTER — Emergency Department (HOSPITAL_COMMUNITY)
Admission: EM | Admit: 2024-07-08 | Discharge: 2024-07-08 | Disposition: A | Discharge status: Home / Self Care | Payer: Self-pay | Attending: Emergency Medicine | Admitting: Emergency Medicine

## 2024-07-08 ENCOUNTER — Encounter: Payer: Self-pay | Admitting: Cardiology

## 2024-07-08 ENCOUNTER — Encounter (HOSPITAL_COMMUNITY): Payer: Self-pay

## 2024-07-08 ENCOUNTER — Other Ambulatory Visit: Payer: Self-pay

## 2024-07-08 ENCOUNTER — Ambulatory Visit (HOSPITAL_BASED_OUTPATIENT_CLINIC_OR_DEPARTMENT_OTHER): Admission: EM | Admit: 2024-07-08 | Payer: Self-pay

## 2024-07-08 DIAGNOSIS — Z79899 Other long term (current) drug therapy: Secondary | ICD-10-CM | POA: Insufficient documentation

## 2024-07-08 DIAGNOSIS — Z7982 Long term (current) use of aspirin: Secondary | ICD-10-CM | POA: Insufficient documentation

## 2024-07-08 DIAGNOSIS — I10 Essential (primary) hypertension: Secondary | ICD-10-CM | POA: Insufficient documentation

## 2024-07-08 DIAGNOSIS — R040 Epistaxis: Secondary | ICD-10-CM | POA: Insufficient documentation

## 2024-07-08 NOTE — ED Provider Notes (Signed)
 [facility] EMERGENCY DEPARTMENT AT [facility] Provider Note   CSN: [id number] Arrival date & time: 07/08/24  1842     Patient presents with: Epistaxis   Joseph Kim is a 58 y.o. male who presents emergency department with a chief complaint of nosebleed.  Approximately 6 weeks ago patient had an NSTEMI cardiac event and was placed on a blood thinner, specifically Effient  by cardiology.  Patient states he has been following up outpatient with cardiology and that everything is going well, but today his nose began to bleed and even with pressure it continued to bleed and was persistent.  Patient states that his nosebleed started at approximately 3:00 today he was unable to get it stopped.  Patient denies any other medical symptom.  He denies trauma or injury to his nose, states that he was just getting out of his truck whenever his nose began to bleed.  Patient does state that he had been in varying temperatures today working and that this could be a possible cause.  Denies fever, chills, chest pain, shortness of breath, nose pain, nasal foreign body, etc.  Patient states that bleeding is only occurring out of the right side of his nose.  Patient states that he has been compliant with his blood thinner.    Epistaxis      Prior to Admission medications   Medication Sig Start Date End Date Taking? Authorizing Provider  aspirin  EC 81 MG tablet Take 1 tablet (81 mg total) by mouth daily. Swallow whole. 05/23/24   Henry Manuelita NOVAK, NP  magnesium 30 MG tablet Take 30 mg by mouth daily.    [provider]  metoprolol  succinate (TOPROL -XL) 50 MG 24 hr tablet Take 1 tablet (50 mg total) by mouth daily. Take with or immediately following a meal. 06/01/24   West, Katlyn D, NP  Misc Natural Products (BEET ROOT) 500 MG CAPS Take 2 capsules by mouth daily.    [provider]  nitroGLYCERIN  (NITROSTAT ) 0.4 MG SL tablet Place 1 tablet (0.4 mg total) under the tongue every 5 (five)  minutes x 3 doses as needed for chest pain. 06/01/24   West, Katlyn D, NP  ondansetron  (ZOFRAN  ODT) 4 MG disintegrating tablet 4mg  ODT q4 hours prn nausea/vomit Patient not taking: Reported on 05/20/2024 03/08/15   Carlyle Lenis, MD  oxyCODONE -acetaminophen  (PERCOCET) 5-325 MG per tablet Take 1-2 tablets by mouth every 4 (four) hours as needed for moderate pain or severe pain. Patient not taking: Reported on 05/20/2024 03/08/15   Carlyle Lenis, MD  prasugrel  (EFFIENT ) 10 MG TABS tablet Take 1 tablet (10 mg total) by mouth daily. 06/01/24   West, Katlyn D, NP  rosuvastatin  (CRESTOR ) 20 MG tablet Take 1 tablet (20 mg total) by mouth daily. 06/01/24   West, Katlyn D, NP  tamsulosin  (FLOMAX ) 0.4 MG CAPS capsule Take 1 capsule (0.4 mg total) by mouth daily. Patient not taking: Reported on 05/20/2024 03/08/15   Carlyle Lenis, MD    Allergies: Amoxicillin    Review of Systems  HENT:  Positive for nosebleeds.     Updated Vital Signs BP (!) 157/86 (BP Location: Left Arm)   Pulse (!) 59   Temp 100 F (37.8 C) (Oral)   Resp 18   Ht 5' 9 (1.753 m)   Wt 100.7 kg   SpO2 99%   BMI 32.78 kg/m   Physical Exam Vitals and nursing note reviewed.  Constitutional:      General: He is awake. He is not  in acute distress.    Appearance: Normal appearance. He is not ill-appearing, toxic-appearing or diaphoretic.  HENT:     Head: Normocephalic and atraumatic.     Nose:     Comments: At time of initial exam, neck can placed in right nasal passage with some clotted blood present on napkin, this napkin was removed by myself and right nasal passage visualized, right nasal passage clear with very small amount of bright red blood that is not clotted, no active bleeding or drainage out of nose.  Left nasal passage clear and normal-appearing, no evidence of blood.  External nose appears normal, no evidence of trauma/injury. Eyes:     General: No scleral icterus. Cardiovascular:     Rate and Rhythm: Normal rate  and regular rhythm.  Pulmonary:     Effort: Pulmonary effort is normal. No respiratory distress.     Breath sounds: Normal breath sounds. No wheezing, rhonchi or rales.  Musculoskeletal:        General: Normal range of motion.  Skin:    General: Skin is warm and dry.     Capillary Refill: Capillary refill takes less than 2 seconds.  Neurological:     General: No focal deficit present.     Mental Status: He is alert and oriented to person, place, and time.  Psychiatric:        Mood and Affect: Mood normal.        Behavior: Behavior normal. Behavior is cooperative.     (all labs ordered are listed, but only abnormal results are displayed) Labs Reviewed - No data to display  EKG: None  Radiology: No results found.   Procedures   Medications Ordered in the ED - No data to display                                  Medical Decision Making  Patient presents to the ED for concern of nosebleed, this involves an extensive number of treatment options, and is a complaint that carries with it a high risk of complications and morbidity.  The differential diagnosis includes anterior nosebleed, posterior nosebleed, trauma/injury, etc.   Co morbidities that complicate the patient evaluation  NSTEMI on blood thinner, hypertension, hyperlipidemia   Additional history obtained:  Reviewed discharge summary from 05/22/2024 for patient was discharged after NSTEMI event, recommendations include dual antiplatelet therapy with aspirin  and Effient .   Lab Tests:  I Ordered, and personally interpreted labs.  The pertinent results include: None   Medicines ordered and prescription drug management:  I have reviewed the patients home medicines and have made adjustments as needed   Test Considered:  CBC: Declined at this time as patient vital signs are stable, based off of my clinical impression in the room, I doubt a large volume loss of blood, since being in the emergency department, no  evidence of large amounts of blood loss, patient not actively bleeding out of nose after I removed napkin out of right nasal passage   Critical Interventions:  None   Problem List / ED Course:  58 year old male, nosebleed, on dual antiplatelet therapy due to NSTEMI approximately 6 weeks ago No trauma or injury, just persistent nosebleed out of right nasal passage Nosebleed originally started around 3:00, by time at presentation in the emergency department I removed napkin that was packing right nasal passage, right nasal passage visualized and clear with small amounts of nonclotted bright red blood,  no significant blood volume loss or drainage out of nose after removal of this napkin Patient's nose no longer actively bleeding After consultation with attending physician discussed possibility of things like Afrin, lidocaine , or cauterization Patient declined these interventions through shared decision making at this time as nose is no longer actively bleeding, patient states that he is okay to leave with return precautions and will come back if nosebleed returns or persist Patient instructed to make cardiology aware of his visit today and the nosebleed which could be due to his antiplatelet medications Patient instructed to continue outpatient medications as prescribed for the time being however Return precautions given Patient discharged Most likely diagnosis is epistaxis possibly due to temperature change as well as patient being on dual antiplatelet therapy due to previous cardiac event, nosebleed stopped prior to any intervention by myself with compression and packing, patient was offered further interventions but through shared decision making declined at this time and felt safe for discharge with return precautions Patient instructed to follow-up with primary care and cardiology as scheduled, sooner if symptoms warrant.   Reevaluation:  After the interventions noted above, I reevaluated  the patient and found that they have :improved   Social Determinants of Health:  none   Dispostion:  After consideration of the diagnostic results and the patients response to treatment, I feel that the patent would benefit from discharge.      Final diagnoses:  Right-sided epistaxis    ED Discharge Orders     None          Cristian Davitt F, PA-C 07/08/24 2317    Patsey Lot, MD 07/10/24 [phone]

## 2024-07-08 NOTE — Discharge Instructions (Signed)
 It was a pleasure taking care of you today.  Based on your history and physical exam I feel you are safe for discharge.  Please continue to monitor your symptoms at home, if your nosebleed returns and is unable to be stopped or is persistent please return to the emergency department or seek further medical care.  Today through shared decision making we decided not to proceed with cautery or another intervention as the nosebleed stopped with only pressure.  Please continue to take your at home medications including your blood thinner as prescribed.  Please return to the Emergency Department or seek further medical care if experiencing the following symptoms including but not limited to persistent nosebleed, nose pain, other uncontrollable bleeding, chest pain, shortness of breath, or other concerning symptom.  Please make your cardiologist aware of your visit today.  Please follow-up with your primary care and specialist as scheduled, sooner if symptoms warrant.

## 2024-07-08 NOTE — Telephone Encounter (Signed)
 Spoke with patient wife after [facility] transferred them over.  Husband nose began to bleed an hour ago and they have been unable to stop it.  Patient is taking aspirin  and EFFIENT .  Spoke with Ole Pepper and her suggestion was for patient to go to the emergency room.

## 2024-07-08 NOTE — Telephone Encounter (Signed)
 Copied from CRM #[id number]. Topic: Clinical Concerns - Emergent Call >> Jul 08, 2024  4:51 PM Wyline GRADE wrote: TRUE Garciamartinez is calling for clinical concerns (Ask: What symptoms are you calling about today, AND how long have you had these symptoms? Must Review HPKW list for symptoms) Document Name of Triage Nurse/BH Rep taking the call when applicable)   Include all details related to the request(s) below: Patient's wife Wyman) is calling stating that her husband nose is bleeding and has been bleeding for an hour and that he is also on blood thinner medication. Tx phone call to [facility] in office.     Confirm and type the Best Contact Number below:  Patient/caller contact number:   [phone] (wife number-Cynthia)          [] Home  [x] Mobile  [] Work [] Other   [x] Okay to leave a voicemail   Medication List:  Current Outpatient Medications:  .  aspirin  81 mg EC tablet, Take 81 mg by mouth., Disp: , Rfl:  .  metoprolol  succinate (TOPROL  XL) 50 mg 24 hr tablet, Take 50 mg by mouth., Disp: , Rfl:  .  prasugreL  HCl (EFFIENT ) 10 mg tablet, Take 10 mg by mouth daily., Disp: , Rfl:  .  rosuvastatin  (CRESTOR ) 20 mg tablet, Take 20 mg by mouth daily., Disp: , Rfl:      Medication Request/Refills: Pharmacy Information (if applicable)   [x] Not Applicable       []  Pharmacy listed  Send Medication Request to:                                                 [] Pharmacy not listed (added to pharmacy list in Epic) Send Medication Request to:      Listed Pharmacies: [facility] - [place], [place] - [address] - PHONE: [phone] - FAX: [phone]

## 2024-07-08 NOTE — Telephone Encounter (Signed)
 H&V Care Navigation CSW Progress Note  Clinical Social Worker contacted patient by phone to f/u on patient assistance forms. Pt answered at [phone]. I let him know I had called Cindy at [phone] and was unable to reach her. He will have her reach out to me. Remain available.    Patient is participating in a Managed Medicaid Plan:  No, self pay only  SDOH Screenings   Food Insecurity: Low Risk  (06/15/2024)   Received from [facility]  Housing: Low Risk  (06/15/2024)   Received from [facility]  Transportation Needs: No Transportation Needs (06/15/2024)   Received from [facility]  Utilities: Low Risk  (06/15/2024)   Received from [facility]  Financial Resource Strain: Medium Risk (06/01/2024)  Tobacco Use: Low Risk  (06/25/2024)   Received from [facility]  Recent Concern: Tobacco Use - Medium Risk (06/02/2024)  Health Literacy: Adequate Health Literacy (06/01/2024)    Marit Lark, MSW, LCSW Clinical Social Worker II [facility] Heart/Vascular Care Navigation  [phone]- work cell phone (preferred)

## 2024-07-08 NOTE — ED Triage Notes (Signed)
 Pt has a nosebleed that started at 3 pm to his right nostril. Pt reports being on a blood thinner.

## 2024-07-08 NOTE — ED Provider Triage Note (Signed)
 Emergency Medicine Provider Triage Evaluation Note  Joseph Kim , a 58 y.o. male  was evaluated in triage.  Pt complains of nosebleed. Nosebleed started at approximately 3:00pm today and states that he is unable to get it stopped. Denies any other symptoms at this time. Patient was recently seen for NSTEMI, prescribed blood thinner after this. Patient has been following up with cardiology outpatient. Denies fever, chills, chest pain, shortness of breath. Patient only complaint is bleeding out of R side of nose, denies trauma.   Review of Systems  Positive: Nose bleeding Negative: Fever, chills, shortness of breath, trouble breathing  Physical Exam  There were no vitals taken for this visit. Gen:   Awake, no distress   Resp:  Normal effort  MSK:   Moves extremities without difficulty  Other:  Napkin placed in right nostril, blood present, R nasal passage able to be visualized, some red blood identified in R nasal passage not clotted, napkin removed and R nasal passage no longer bleeding   Medical Decision Making  Medically screening exam initiated at 6:46 PM.  Appropriate orders placed.  Alverto Shedd was informed that the remainder of the evaluation will be completed by another provider, this initial triage assessment does not replace that evaluation, and the importance of remaining in the ED until their evaluation is complete.  Due to history and physical exam, no orders placed at this time, patient offered possible treatment in the room with possible cauterization if area of bleeding was able to be identified, patient states that he feels this is not necessary at this time as bleeding has subsided, patient states that he would like to be discharged and is okay with leaving with return precautions.  Patient ultimately discharged by myself from triage.   Averill Pons F, PA-C 07/08/24 2258

## 2024-07-13 ENCOUNTER — Telehealth: Payer: Self-pay | Admitting: Licensed Clinical Social Worker

## 2024-07-13 NOTE — Telephone Encounter (Signed)
 H&V Care Navigation CSW Progress Note  Clinical Social Worker contacted patient by phone to f/u on assistance applications. No answer from pt today at [phone], attempted wife Dorthea as well at [phone], left voicemail on her cell phone. Remain available should pt/pt wife re-engage for assistance. Had been planning on submitting assistance applications last week.  Patient is participating in a Managed Medicaid Plan:  No, self pay only  SDOH Screenings   Food Insecurity: Low Risk  (06/15/2024)   Received from [facility]  Housing: Low Risk  (06/15/2024)   Received from [facility]  Transportation Needs: No Transportation Needs (06/15/2024)   Received from [facility]  Utilities: Low Risk  (06/15/2024)   Received from [facility]  Financial Resource Strain: Medium Risk (06/01/2024)  Tobacco Use: Medium Risk (07/08/2024)  Health Literacy: Adequate Health Literacy (06/01/2024)     Marit Lark, MSW, LCSW Clinical Social Worker II [facility] Heart/Vascular Care Navigation  [phone]- work cell phone (preferred)

## 2024-07-20 ENCOUNTER — Telehealth: Payer: Self-pay | Admitting: Licensed Clinical Social Worker

## 2024-07-20 NOTE — Telephone Encounter (Signed)
 H&V Care Navigation CSW Progress Note  Clinical Social Worker contacted wife Dorthea  at [phone] ([employer] on file, was the one that was to submit applications with/for pt), I left voicemail again on her cell phone. Remain available should pt/pt wife re-engage for assistance. At this time, now have limited f/u from pt/pt family. No notes that pt has submitted financial assistance.   Patient is participating in a Managed Medicaid Plan:  No, self pay only  SDOH Screenings   Food Insecurity: Low Risk  (06/15/2024)   Received from [facility]  Housing: Low Risk  (06/15/2024)   Received from [facility]  Transportation Needs: No Transportation Needs (06/15/2024)   Received from [facility]  Utilities: Low Risk  (06/15/2024)   Received from [facility]  Financial Resource Strain: Medium Risk (06/01/2024)  Tobacco Use: Medium Risk (07/08/2024)  Health Literacy: Adequate Health Literacy (06/01/2024)    Marit Lark, MSW, LCSW Clinical Social Worker II [facility] Heart/Vascular Care Navigation  [phone]- work cell phone (preferred)

## 2024-08-01 LAB — LIPID PANEL
Chol/HDL Ratio: 3.5 ratio (ref 0.0–5.0)
Cholesterol, Total: 107 mg/dL (ref 100–199)
HDL: 31 mg/dL — ABNORMAL LOW (ref >39)
LDL Chol Calc (NIH): 61 mg/dL (ref 0–99)
Triglycerides: 72 mg/dL (ref 0–149)
VLDL Cholesterol Cal: 15 mg/dL (ref 5–40)

## 2024-08-01 LAB — HEPATIC FUNCTION PANEL
ALT: 25 IU/L (ref 0–44)
AST: 18 IU/L (ref 0–40)
Albumin: 4.6 g/dL (ref 3.8–4.9)
Alkaline Phosphatase: 86 IU/L (ref 44–121)
Bilirubin Total: 0.6 mg/dL (ref 0.0–1.2)
Bilirubin, Direct: 0.19 mg/dL (ref 0.00–0.40)
Total Protein: 6.6 g/dL (ref 6.0–8.5)

## 2024-08-02 ENCOUNTER — Ambulatory Visit: Payer: Self-pay | Admitting: Cardiology

## 2024-08-03 ENCOUNTER — Telehealth: Payer: Self-pay | Admitting: Licensed Clinical Social Worker

## 2024-08-03 NOTE — Telephone Encounter (Signed)
 H&V Care Navigation CSW Progress Note  Clinical Social Worker contacted caregiver by phone to confirm I received paperwork for CAFA when I returned today from out of office. Submitted to [employer], will check for any updates/missing paperwork needs. No additional questions from pt wife Dorthea today.  Patient is participating in a Managed Medicaid Plan:  No, self pay only  SDOH Screenings   Food Insecurity: Low Risk  (06/15/2024)   Received from [facility]  Housing: Low Risk  (06/15/2024)   Received from [facility]  Transportation Needs: No Transportation Needs (06/15/2024)   Received from [facility]  Utilities: Low Risk  (06/15/2024)   Received from [facility]  Financial Resource Strain: Medium Risk (06/01/2024)  Tobacco Use: Medium Risk (07/08/2024)  Health Literacy: Adequate Health Literacy (06/01/2024)    Marit Lark, MSW, LCSW Clinical Social Worker II [facility] Heart/Vascular Care Navigation  [phone]- work cell phone (preferred)

## 2024-08-07 ENCOUNTER — Ambulatory Visit: Payer: Self-pay | Attending: Cardiology | Admitting: Cardiology

## 2024-08-07 ENCOUNTER — Encounter: Payer: Self-pay | Admitting: Cardiology

## 2024-08-07 VITALS — BP 140/78 | HR 65 | Ht 69.0 in | Wt 209.3 lb

## 2024-08-07 DIAGNOSIS — M791 Myalgia, unspecified site: Secondary | ICD-10-CM

## 2024-08-07 DIAGNOSIS — E782 Mixed hyperlipidemia: Secondary | ICD-10-CM

## 2024-08-07 DIAGNOSIS — T466X5D Adverse effect of antihyperlipidemic and antiarteriosclerotic drugs, subsequent encounter: Secondary | ICD-10-CM

## 2024-08-07 DIAGNOSIS — R7303 Prediabetes: Secondary | ICD-10-CM

## 2024-08-07 DIAGNOSIS — I2510 Atherosclerotic heart disease of native coronary artery without angina pectoris: Secondary | ICD-10-CM

## 2024-08-07 NOTE — Patient Instructions (Addendum)
 Medication Instructions:  Your physician has recommended you make the following change in your medication:  STOP: Rosuvastatin  (Crestor )  *If you need a refill on your cardiac medications before your next appointment, please call your pharmacy*  Lab Work: In 12 weeks: HgbA1c, Lipids If you have labs (blood work) drawn today and your tests are completely normal, you will receive your results only by: MyChart Message (if you have MyChart) OR A paper copy in the mail If you have any lab test that is abnormal or we need to change your treatment, we will call you to review the results.  Follow-Up: At [facility], you and your health needs are our priority.  As part of our continuing mission to provide you with exceptional heart care, our providers are all part of one team.  This team includes your primary Cardiologist (physician) and Advanced Practice Providers or APPs (Physician Assistants and Nurse Practitioners) who all work together to provide you with the care you need, when you need it.  Your next appointment:   9 month(s)  Provider:   Kardie Tobb, DO     Other Instructions Please take your blood pressure daily for 1 week and send in a MyChart message. Please include heart rates. (One message at the end of the 1 week).   HOW TO TAKE YOUR BLOOD PRESSURE: Rest 5 minutes before taking your blood pressure. Don't smoke or drink caffeinated beverages for at least 30 minutes before. Take your blood pressure before (not after) you eat. Sit comfortably with your back supported and both feet on the floor (don't cross your legs). Elevate your arm to heart level on a table or a desk. Use the proper sized cuff. It should fit smoothly and snugly around your bare upper arm. There should be enough room to slip a fingertip under the cuff. The bottom edge of the cuff should be 1 inch above the crease of the elbow. Ideally, take 3 measurements at one sitting and record the average.

## 2024-08-07 NOTE — Progress Notes (Signed)
 Cardiology Office Note:    Date:  08/10/2024   ID:  Joseph Kim, DOB 1965/12/25, MRN [id number]  PCP:  Xenia Beryl BIRCH, MD  Cardiologist:  Dub Huntsman, DO  Electrophysiologist:  None   Referring MD: No ref. provider found    I am ok    History of Present Illness:    Joseph Kim is a 58 y.o. male with a hx of coronary artery disease status post DES to the LAD, hyperlipidemia, prediabetes here today for follow-up visit.  He experiences new onset joint pain, which he associates with his current statin therapy, rosuvastatin  20 mg. He has coronary artery disease and underwent stent placement. He is advised to maintain LDL cholesterol levels below 55 mg/dL due to his stent. His current LDL is 61 mg/dL, and HDL is 39 mg/dL. He is concerned about the effectiveness of his current cholesterol management and questions the impact of lifestyle changes versus medication. He continues to take Prasugrel  and aspirin  as prescribed.  He has a family history of elevated LP(a) levels, and his own LP(a) is elevated at 129 mg/dL. 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  He has made significant lifestyle changes, including dietary modifications, focusing on whole foods and reducing sugar intake. He avoids wheat products and has lost 24 pounds since his last visit. He is interested in monitoring his hemoglobin A1c along with his lipid profile in the future.    Past Medical History:  Diagnosis Date   Kidney stones     Past Surgical History:  Procedure Laterality Date   CORONARY STENT INTERVENTION N/A 05/21/2024   Procedure: CORONARY STENT INTERVENTION;  Surgeon: Mady Bruckner, MD;  Location: [facility];  Service: Cardiovascular;  Laterality: N/A;   CORONARY ULTRASOUND/IVUS N/A 05/21/2024   Procedure: Coronary Ultrasound/IVUS;  Surgeon: Mady Bruckner, MD;  Location: [facility];   Service: Cardiovascular;  Laterality: N/A;   LEFT HEART CATH AND CORONARY ANGIOGRAPHY N/A 05/21/2024   Procedure: LEFT HEART CATH AND CORONARY ANGIOGRAPHY;  Surgeon: Mady Bruckner, MD;  Location: [facility];  Service: Cardiovascular;  Laterality: N/A;   TONSILLECTOMY      Current Medications: Current Meds  Medication Sig   aspirin  EC 81 MG tablet Take 1 tablet (81 mg total) by mouth daily. Swallow whole.   metoprolol  succinate (TOPROL -XL) 50 MG 24 hr tablet Take 1 tablet (50 mg total) by mouth daily. Take with or immediately following a meal.   nitroGLYCERIN  (NITROSTAT ) 0.4 MG SL tablet Place 1 tablet (0.4 mg total) under the tongue every 5 (five) minutes x 3 doses as needed for chest pain.   prasugrel  (EFFIENT ) 10 MG TABS tablet Take 1 tablet (10 mg total) by mouth daily.   [DISCONTINUED] rosuvastatin  (CRESTOR ) 20 MG tablet Take 1 tablet (20 mg total) by mouth daily.     Allergies:   Amoxicillin   Social History   Socioeconomic History   Marital status: Married    Spouse name: Not on file   Number of children: 3   Years of education: Not on file   Highest education level: Not on file  Occupational History    Employer: [employer]  Tobacco Use   Smoking status: Former   Smokeless tobacco: Never  Substance and Sexual Activity   Alcohol use: Yes    Comment: rarely   Drug use: No   Sexual activity: Not on  file  Other Topics Concern   Not on file  Social History Narrative   Not on file   Social Drivers of Health   Financial Resource Strain: Medium Risk (06/01/2024)   Overall Financial Resource Strain (CARDIA)    Difficulty of Paying Living Expenses: Somewhat hard  Food Insecurity: Low Risk  (06/15/2024)   Received from [facility]   Hunger Vital Sign    Within the past 12 months, you worried that your food would run out before you got money to buy more: Never true    Within the past 12 months, the food you bought just didn't last and you didn't have money  to get more. : Never true  Transportation Needs: No Transportation Needs (06/15/2024)   Received from [facility]    In the past 12 months, has lack of reliable transportation kept you from medical appointments, meetings, work or from getting things needed for daily living? : No  Physical Activity: Not on file  Stress: Not on file  Social Connections: Not on file     Family History: The patient's family history includes Heart disease in his unknown relative.  ROS:   Review of Systems  Constitution: Negative for decreased appetite, fever and weight gain.  HENT: Negative for congestion, ear discharge, hoarse voice and sore throat.   Eyes: Negative for discharge, redness, vision loss in right eye and visual halos.  Cardiovascular: Negative for chest pain, dyspnea on exertion, leg swelling, orthopnea and palpitations.  Respiratory: Negative for cough, hemoptysis, shortness of breath and snoring.   Endocrine: Negative for heat intolerance and polyphagia.  Hematologic/Lymphatic: Negative for bleeding problem. Does not bruise/bleed easily.  Skin: Negative for flushing, nail changes, rash and suspicious lesions.  Musculoskeletal: Negative for arthritis, joint pain, muscle cramps, myalgias, neck pain and stiffness.  Gastrointestinal: Negative for abdominal pain, bowel incontinence, diarrhea and excessive appetite.  Genitourinary: Negative for decreased libido, genital sores and incomplete emptying.  Neurological: Negative for brief paralysis, focal weakness, headaches and loss of balance.  Psychiatric/Behavioral: Negative for altered mental status, depression and suicidal ideas.  Allergic/Immunologic: Negative for HIV exposure and persistent infections.    EKGs/Labs/Other Studies Reviewed:    The following studies were reviewed today:   EKG:  The ekg ordered today demonstrates   Recent Labs: 05/20/2024: B Natriuretic Peptide 24.4 05/21/2024: Magnesium 2.6 05/22/2024:  Hemoglobin 13.3; Platelets 222 05/29/2024: BUN 17; Creatinine, Ser 1.20; NT-Pro BNP 88; Potassium 4.8; Sodium 137 07/31/2024: ALT 25  Recent Lipid Panel    Component Value Date/Time   CHOL 107 07/31/2024 0814   TRIG 72 07/31/2024 0814   HDL 31 (L) 07/31/2024 0814   CHOLHDL 3.5 07/31/2024 0814   CHOLHDL 6.3 05/21/2024 0815   VLDL 62 (H) 05/21/2024 0815   LDLCALC 61 07/31/2024 0814    Physical Exam:    VS:  BP (!) 140/78   Pulse 65   Ht 5' 9 (1.753 m)   Wt 209 lb 4.8 oz (94.9 kg)   SpO2 98%   BMI 30.91 kg/m     Wt Readings from Last 3 Encounters:  08/07/24 209 lb 4.8 oz (94.9 kg)  07/08/24 222 lb 0.1 oz (100.7 kg)  06/01/24 222 lb (100.7 kg)     GEN: Well nourished, well developed in no acute distress HEENT: Normal NECK: No JVD; No carotid bruits LYMPHATICS: No lymphadenopathy CARDIAC: S1S2 noted,RRR, no murmurs, rubs, gallops RESPIRATORY:  Clear to auscultation without rales, wheezing or rhonchi  ABDOMEN: Soft, non-tender,  non-distended, +bowel sounds, no guarding. EXTREMITIES: No edema, No cyanosis, no clubbing MUSCULOSKELETAL:  No deformity  SKIN: Warm and dry NEUROLOGIC:  Alert and oriented x 3, non-focal PSYCHIATRIC:  Normal affect, good insight  ASSESSMENT:    1. Mixed hyperlipidemia   2. Prediabetes   3. Coronary artery disease involving native coronary artery of native heart without angina pectoris   4. Myalgia due to statin    PLAN:    Coronary artery disease post-stent placement- No anginal symptoms. He is status post DES to the LAD. Continues on dual antiplatelet therapy with prasugrel  and aspirin , recommended for at least 9 months to a year post-stent placement. - Continue prasugrel  and aspirin  for at least 9 months to a year post-stent placement.  Dyslipidemia with elevated LDL and Lp(a), statin intolerance - elevated LDL at 61 mg/dL, which should be less than 55 mg/dL for secondary prevention post-stent. Statin intolerance due to myalgia. Discussed  the importance of LDL reduction for secondary prevention and the role of statins and other lipid-lowering therapies. He prefers to stop statin therapy and monitor LDL levels. Explained that if LDL remains above 55 mg/dL, atorvastatin 10 mg may be considered. Discussed alternative lipid-lowering therapies such as PCSK9 inhibitors and glycerin if statins are not tolerated. - Stop rosuvastatin . - Monitor LDL levels. - Repeat lipid profile in 12 weeks. - If LDL remains above 55 mg/dL, consider starting atorvastatin 10 mg.  Statin-associated myalgia - Experiencing myalgia associated with rosuvastatin  use. Discussed switching to a different statin or alternative lipid-lowering therapies if necessary. - Stop rosuvastatin . - Consider atorvastatin 10 mg if LDL remains above 55 mg/dL.  Prediabetes prediabetes with hemoglobin A1c previously at 5.9%. Discussed the impact of diet, particularly carbohydrate intake, on blood glucose levels. Emphasized the importance of monitoring and lifestyle modifications. - Repeat hemoglobin A1c in 12 weeks. - Monitor carbohydrate intake.   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 - Consider referral to ENT if epistaxis recurs.  Hypertension - Blood pressure readings have been around 118 mmHg systolic, indicating good control. - Send a week's worth of blood pressure readings via MyChart.  The patient is in agreement with the above plan. The patient left the office in stable condition.  The patient will follow up in   Medication Adjustments/Labs and Tests Ordered: Current medicines are reviewed at length with the patient today.  Concerns regarding medicines are outlined above.  Orders Placed This Encounter  Procedures   Hemoglobin A1c   Lipid panel   No orders of the defined types were placed in this encounter.   Patient Instructions  Medication Instructions:   Your physician has recommended you make the following change in your medication:  STOP: Rosuvastatin  (Crestor )  *If you need a refill on your cardiac medications before your next appointment, please call your pharmacy*  Lab Work: In 12 weeks: HgbA1c, Lipids If you have labs (blood work) drawn today and your tests are completely normal, you will receive your results only by: MyChart Message (if you have MyChart) OR A paper copy in the mail If you have any lab test that is abnormal or we need to change your treatment, we will call you to review the results.  Follow-Up: At [facility], you and your health needs are our priority.  As part of our continuing mission to provide you with exceptional heart care, our providers are all part of one team.  This team includes your primary Cardiologist (physician) and Advanced Practice Providers or APPs (Physician Assistants and Nurse Practitioners) who all work together to provide you with the care you need, when you need it.  Your next appointment:   9 month(s)  Provider:   Zvi Duplantis, DO     Other Instructions Please take your blood pressure daily for 1 week and send in a MyChart message. Please include heart rates. (One message at the end of the 1 week).   HOW TO TAKE YOUR BLOOD PRESSURE: Rest 5 minutes before taking your blood pressure. Don't smoke or drink caffeinated beverages for at least 30 minutes before. Take your blood pressure before (not after) you eat. Sit comfortably with your back supported and both feet on the floor (don't cross your legs). Elevate your arm to heart level on a table or a desk. Use the proper sized cuff. It should fit smoothly and snugly around your bare upper arm. There should be enough room to slip a fingertip under the cuff. The bottom edge of the cuff should be 1 inch above the crease of the elbow. Ideally, take 3 measurements at one sitting and record the average.        Adopting a Healthy  Lifestyle.  Know what a healthy weight is for you (roughly BMI <25) and aim to maintain this   Aim for 7+ servings of fruits and vegetables daily   65-80+ fluid ounces of water or unsweet tea for healthy kidneys   Limit to max 1 drink of alcohol per day; avoid smoking/tobacco   Limit animal fats in diet for cholesterol and heart health - choose grass fed whenever available   Avoid highly processed foods, and foods high in saturated/trans fats   Aim for low stress - take time to unwind and care for your mental health   Aim for 150 min of moderate intensity exercise weekly for heart health, and weights twice weekly for bone health   Aim for 7-9 hours of sleep daily   When it comes to diets, agreement about the perfect plan isnt easy to find, even among the experts. Experts at the [facility] [employer] developed an idea known as the Healthy Eating Plate. Just imagine a plate divided into logical, healthy portions.   The emphasis is on diet quality:   Load up on vegetables and fruits - one-half of your plate: Aim for color and variety, and remember that potatoes dont count.   Go for whole grains - one-quarter of your plate: Whole wheat, barley, wheat berries, quinoa, oats, brown rice, and foods made with them. If you want pasta, go with whole wheat pasta.   Protein power - one-quarter of your plate: Fish, chicken, beans, and nuts are all healthy, versatile protein sources. Limit red meat.   The diet, however, does go beyond the plate, offering a few other suggestions.   Use healthy plant oils, such as olive, canola, soy, corn, sunflower and peanut. Check the labels, and avoid partially hydrogenated oil, which have unhealthy trans fats.   If youre thirsty, drink water. Coffee and tea are good in moderation, but skip sugary drinks and limit milk and dairy products to one or two daily servings.   The type of carbohydrate in the diet is more important than the amount. Some  sources of carbohydrates, such as vegetables, fruits, whole grains, and beans-are healthier than others.   Finally, stay active  Signed, Ulyana Pitones, DO  08/10/2024 5:24 PM  [facility]

## 2024-08-11 ENCOUNTER — Telehealth: Payer: Self-pay | Admitting: Licensed Clinical Social Worker

## 2024-08-11 NOTE — Telephone Encounter (Signed)
 H&V Care Navigation CSW Progress Note  Clinical Social Worker contacted caregiver by phone to f/u on CAFA application submission. Pt approved for partial assistance. Will mail a copy of this approval to pt home address. Sent text to pt wife to let her know paperwork was processed since she had reached out once submitted. Remain available as needed.   CHARITY COMPLETE. PATIENT APPROVED FOR 50% FINANCIAL ASSISTANCE PER CAFA APPLICATION AND SUPPORTING DOCUMENTATION. CAFA APP SCANNED TO ACCOUNT [id number] APPROVAL DATES OF [employer] ---- 07/30/24 - 01/30/25 APPROVAL LETTER ON ACCOUNT [id number]    Patient is participating in a Managed Medicaid Plan:  No, self pay only  SDOH Screenings   Food Insecurity: Low Risk  (06/15/2024)   Received from [facility]  Housing: Low Risk  (06/15/2024)   Received from [facility]  Transportation Needs: No Transportation Needs (06/15/2024)   Received from [facility]  Utilities: Low Risk  (06/15/2024)   Received from [facility]  Financial Resource Strain: Medium Risk (06/01/2024)  Tobacco Use: Medium Risk (08/07/2024)  Health Literacy: Adequate Health Literacy (06/01/2024)    Marit Lark, MSW, LCSW Clinical Social Worker II [facility] Care Navigation  [phone]- work cell phone (preferred)

## 2024-09-16 ENCOUNTER — Other Ambulatory Visit (HOSPITAL_COMMUNITY): Payer: Self-pay

## 2024-10-04 ENCOUNTER — Emergency Department (HOSPITAL_COMMUNITY)
Admission: EM | Admit: 2024-10-04 | Discharge: 2024-10-04 | Disposition: A | Discharge status: Home / Self Care | Payer: Self-pay | Attending: Emergency Medicine | Admitting: Emergency Medicine

## 2024-10-04 ENCOUNTER — Other Ambulatory Visit: Payer: Self-pay

## 2024-10-04 ENCOUNTER — Encounter (HOSPITAL_COMMUNITY): Payer: Self-pay | Admitting: *Deleted

## 2024-10-04 ENCOUNTER — Emergency Department (HOSPITAL_COMMUNITY): Payer: Self-pay

## 2024-10-04 DIAGNOSIS — R072 Precordial pain: Secondary | ICD-10-CM | POA: Insufficient documentation

## 2024-10-04 DIAGNOSIS — Z7982 Long term (current) use of aspirin: Secondary | ICD-10-CM | POA: Insufficient documentation

## 2024-10-04 LAB — CBC
HCT: 41.7 % (ref 39.0–52.0)
Hemoglobin: 13.9 g/dL (ref 13.0–17.0)
MCH: 29.6 pg (ref 26.0–34.0)
MCHC: 33.3 g/dL (ref 30.0–36.0)
MCV: 88.7 fL (ref 80.0–100.0)
Platelets: 189 K/uL (ref 150–400)
RBC: 4.7 MIL/uL (ref 4.22–5.81)
RDW: 12.3 % (ref 11.5–15.5)
WBC: 6.9 K/uL (ref 4.0–10.5)
nRBC: 0 % (ref 0.0–0.2)

## 2024-10-04 LAB — BASIC METABOLIC PANEL WITH GFR
Anion gap: 10 (ref 5–15)
BUN: 23 mg/dL — ABNORMAL HIGH (ref 6–20)
CO2: 25 mmol/L (ref 22–32)
Calcium: 8.7 mg/dL — ABNORMAL LOW (ref 8.9–10.3)
Chloride: 106 mmol/L (ref 98–111)
Creatinine, Ser: 1.1 mg/dL (ref 0.61–1.24)
GFR, Estimated: >60 mL/min (ref >60)
Glucose, Bld: 109 mg/dL — ABNORMAL HIGH (ref 70–99)
Potassium: 4.1 mmol/L (ref 3.5–5.1)
Sodium: 141 mmol/L (ref 135–145)

## 2024-10-04 LAB — TROPONIN I (HIGH SENSITIVITY)
Troponin I (High Sensitivity): 5 ng/L (ref <18)
Troponin I (High Sensitivity): 4 ng/L (ref <18)

## 2024-10-04 NOTE — Discharge Instructions (Signed)
 It was a pleasure taking care of you here today  Your workup today was reassuring  I would make sure to follow-up with your cardiologist  Return for any new or worsening symptoms

## 2024-10-04 NOTE — ED Provider Triage Note (Signed)
 Emergency Medicine Provider Triage Evaluation Note  Joseph Kim , a 58 y.o. male  was evaluated in triage.  Pt complains of chest pain x1 week, fairly persistent.  Has had increased activity and thought maybe he pulled a muscle.  Hx of MI with stent x2.  Follows with cardiology, Dr. Sheena.  Review of Systems  Positive: Chest pain Negative: fever  Physical Exam  BP 113/65 (BP Location: Right Arm)   Pulse (!) 59   Temp 97.9 F (36.6 C)   Resp 16   SpO2 98%  Gen:   Awake, no distress   Resp:  Normal effort  MSK:   Moves extremities without difficulty  Other:    Medical Decision Making  Medically screening exam initiated at 6:14 AM.  Appropriate orders placed.  Joseph Kim was informed that the remainder of the evaluation will be completed by another provider, this initial triage assessment does not replace that evaluation, and the importance of remaining in the ED until their evaluation is complete.  Chest pain x1 week.  EKG, labs, CXR done.  First trop negative.  Awaiting repeat.   Jarold Olam HERO, PA-C 10/04/24 [phone]

## 2024-10-04 NOTE — ED Provider Notes (Signed)
 [place] [facility] AT [facility] Provider Note   CSN: [id number] Arrival date & time: 10/04/24  9642    Patient presents with: Chest Pain   Joseph Kim is a 58 y.o. male here for evaluation of chest pain.  Over the last week.  Constant in nature.  Nonexertional, nonpleuritic in nature.  He had NSTEMI with stenting in June.  Has been doing otherwise well, has lost over 30 pounds, increase his activity.  States his labs have improved.  Thought maybe his pain was muscle related.  Pain does not radiate.  No associated diaphoresis, nausea, vomiting, shortness of breath.  He is able to complete his ADLs without any exertional chest pain or dyspnea on exertion.  Pain is nonpleuritic, nonexertional in nature.  No recent illnesses.  No fever, cough, back pain, abdominal pain, nausea, vomiting, pain or swelling to lower legs.  No history of PE or DVT.  No recent surgery, immobilization malignancy.  Pain does not feel similar to when he had his NSTEMI this summer.   HPI     Prior to Admission medications   Medication Sig Start Date End Date Taking? Authorizing Provider  aspirin  EC 81 MG tablet Take 1 tablet (81 mg total) by mouth daily. Swallow whole. 05/23/24   Henry Manuelita NOVAK, NP  metoprolol  succinate (TOPROL -XL) 50 MG 24 hr tablet Take 1 tablet (50 mg total) by mouth daily. Take with or immediately following a meal. 06/01/24   West, Katlyn D, NP  nitroGLYCERIN  (NITROSTAT ) 0.4 MG SL tablet Place 1 tablet (0.4 mg total) under the tongue every 5 (five) minutes x 3 doses as needed for chest pain. 06/01/24   West, Katlyn D, NP  prasugrel  (EFFIENT ) 10 MG TABS tablet Take 1 tablet (10 mg total) by mouth daily. 06/01/24   West, Katlyn D, NP    Allergies: Amoxicillin    Review of Systems  Constitutional: Negative.   HENT: Negative.    Respiratory: Negative.    Cardiovascular:  Positive for chest pain. Negative for palpitations and leg swelling.  Gastrointestinal: Negative.    Genitourinary: Negative.   Musculoskeletal: Negative.   Skin: Negative.   Neurological: Negative.   All other systems reviewed and are negative.   Updated Vital Signs BP 136/81   Pulse (!) 56   Temp 97.7 F (36.5 C)   Resp 18   SpO2 100%   Physical Exam Vitals and nursing note reviewed.  Constitutional:      General: He is not in acute distress.    Appearance: He is well-developed. He is not ill-appearing, toxic-appearing or diaphoretic.  HENT:     Head: Normocephalic and atraumatic.  Eyes:     Pupils: Pupils are equal, round, and reactive to light.  Cardiovascular:     Rate and Rhythm: Normal rate and regular rhythm.     Pulses:          Radial pulses are 2+ on the right side and 2+ on the left side.       Dorsalis pedis pulses are 2+ on the right side and 2+ on the left side.     Heart sounds: Normal heart sounds.  Pulmonary:     Effort: Pulmonary effort is normal. No respiratory distress.     Breath sounds: Normal breath sounds.  Chest:     Comments: Nontender chest wall, no crepitus or step-off Abdominal:     General: Bowel sounds are normal. There is no distension or abdominal bruit.  Palpations: Abdomen is soft. There is no hepatomegaly or mass.     Tenderness: There is no abdominal tenderness. There is no guarding or rebound.     Comments: Soft, nontender  Musculoskeletal:        General: Normal range of motion.     Cervical back: Normal range of motion and neck supple.     Right lower leg: No tenderness. No edema.     Left lower leg: No tenderness. No edema.     Comments: No bony tenderness, compartments soft, full range of motion. Neg Homans sign  Skin:    General: Skin is warm and dry.     Capillary Refill: Capillary refill takes less than 2 seconds.     Comments: No obvious rashes or lesions on exposed skin  Neurological:     General: No focal deficit present.     Mental Status: He is alert and oriented to person, place, and time.     (all labs  ordered are listed, but only abnormal results are displayed) Labs Reviewed  BASIC METABOLIC PANEL WITH GFR - Abnormal; Notable for the following components:      Result Value   Glucose, Bld 109 (*)    BUN 23 (*)    Calcium  8.7 (*)    All other components within normal limits  CBC  TROPONIN I (HIGH SENSITIVITY)  TROPONIN I (HIGH SENSITIVITY)    EKG: EKG Interpretation Date/Time:  Sunday October 04 2024 04:06:59 EDT Ventricular Rate:  61 PR Interval:  120 QRS Duration:  82 QT Interval:  400 QTC Calculation: 402 R Axis:   -6  Text Interpretation: Normal sinus rhythm Nonspecific T wave abnormality Abnormal ECG When compared with ECG of 01-Jun-2024 14:41, No significant change since last tracing Confirmed by Francesca Fallow ([id number]) on 10/04/2024 3:33:55 PM  Radiology: [facility] Chest 2 View Result Date: 10/04/2024 CLINICAL DATA:  Chest pain in EXAM: CHEST - 2 VIEW COMPARISON:  05/29/2024 FINDINGS: The lungs are clear without focal pneumonia, edema, pneumothorax or pleural effusion. The cardiopericardial silhouette is within normal limits for size. No acute bony abnormality. IMPRESSION: No active cardiopulmonary disease. Electronically Signed   By: Camellia Candle M.D.   On: 10/04/2024 05:20     Procedures   Medications Ordered in the ED - No data to display  58 year old multiple medical comorbidities here for evaluation of chest pain.  Began about a week ago.  Constant in nature.  Nonexertional, nonpleuritic in nature.  No associated radiation, diaphoresis, nausea, vomiting.  History of NSTEMI in June and subsequent stenting.  Pain today does not feel similar to his prior NSTEMI.  He has recently increased his activity, lost weight and had improvement on labs and thinks maybe this is musculoskeletal nature.  He has no clinical evidence of VTE on exam.  Neurovascularly intact.  Abdomen soft, nontender.  He has no crepitus or obvious traumatic injury to his chest wall.  Plan on labs and  imaging, reassess  Labs and imaging personally viewed and interpreted:  CBC without leukocytosis Metabolic panel without significant abnormality Troponin 5--4 EKG without ischemic changes Chest x-ray without cardiomegaly, pulm edema, pneumothorax, infiltrate  Discussed results with patient, family at bedside.  Reassuring workup thus far.  Will have him keep close follow-up with cardiology in the outpatient setting.  He can return for new or worsening symptoms.  At this time I have low suspicion for acute ACS, unstable angina, PE, dissection, pneumothorax, perforated viscus, myocarditis, endocarditis, pericarditis, intra-abdominal etiology, traumatic injury.  The patient has been appropriately medically screened and/or stabilized in the ED. I have low suspicion for any other emergent medical condition which would require further screening, evaluation or treatment in the ED or require inpatient management.  Patient is hemodynamically stable and in no acute distress.  Patient able to ambulate in department prior to ED.  Evaluation does not show acute pathology that would require ongoing or additional emergent interventions while in the emergency department or further inpatient treatment.  I have discussed the diagnosis with the patient and answered all questions.  Pain is been managed while in the emergency department and patient has no further complaints prior to discharge.  Patient is comfortable with plan discussed in room and is stable for discharge at this time.  I have discussed strict return precautions for returning to the emergency department.  Patient was encouraged to follow-up with PCP/specialist refer to at discharge.                                   Medical Decision Making Amount and/or Complexity of Data Reviewed Independent Historian: spouse External Data Reviewed: labs, radiology, ECG and notes. Labs: ordered. Decision-making details documented in ED Course. Radiology:  ordered and independent interpretation performed. Decision-making details documented in ED Course. ECG/medicine tests: ordered and independent interpretation performed. Decision-making details documented in ED Course.  Risk OTC drugs. Prescription drug management. Decision regarding hospitalization. Diagnosis or treatment significantly limited by social determinants of health.       Final diagnoses:  Precordial pain    ED Discharge Orders     None          Iram Lundberg A, PA-C 10/04/24 1541    Francesca Elsie CROME, MD 10/05/24 [phone]

## 2024-10-04 NOTE — ED Triage Notes (Signed)
 Pt has been having generalized chest pain for a week.  Pt states that the pain feels like a pulled muscle. Pt tells me he had a heart attack in June and had cardiac stents placed.  Pt states that he woke up around 2 am with increased CP and took sl nitroglycerin  which gave him some relief.  No sob or nausea with his.

## 2024-12-14 ENCOUNTER — Other Ambulatory Visit (HOSPITAL_BASED_OUTPATIENT_CLINIC_OR_DEPARTMENT_OTHER): Payer: Self-pay

## 2024-12-15 ENCOUNTER — Other Ambulatory Visit (HOSPITAL_BASED_OUTPATIENT_CLINIC_OR_DEPARTMENT_OTHER): Payer: Self-pay
